# Patient Record
Sex: Male | Born: 1970 | Race: Black or African American | Hispanic: No | Marital: Married | Smoking: Never smoker
Health system: Southern US, Community
[De-identification: ages and names within clinical notes are randomized; demographics above are authoritative.]

## PROBLEM LIST (undated history)

## (undated) DIAGNOSIS — I10 Essential (primary) hypertension: Secondary | ICD-10-CM

## (undated) DIAGNOSIS — E785 Hyperlipidemia, unspecified: Secondary | ICD-10-CM

## (undated) HISTORY — PX: VASECTOMY: SHX75

## (undated) HISTORY — DX: Hyperlipidemia, unspecified: E78.5

## (undated) HISTORY — DX: Essential (primary) hypertension: I10

---

## 2017-06-21 DIAGNOSIS — E669 Obesity, unspecified: Secondary | ICD-10-CM | POA: Insufficient documentation

## 2017-12-02 ENCOUNTER — Encounter: Payer: Self-pay | Admitting: Osteopathic Medicine

## 2017-12-02 ENCOUNTER — Ambulatory Visit (INDEPENDENT_AMBULATORY_CARE_PROVIDER_SITE_OTHER): Payer: No Typology Code available for payment source | Admitting: Osteopathic Medicine

## 2017-12-02 VITALS — BP 132/86 | HR 59 | Temp 98.0°F | Wt 229.9 lb

## 2017-12-02 DIAGNOSIS — M25561 Pain in right knee: Secondary | ICD-10-CM | POA: Diagnosis not present

## 2017-12-02 DIAGNOSIS — Z0000 Encounter for general adult medical examination without abnormal findings: Secondary | ICD-10-CM | POA: Diagnosis not present

## 2017-12-02 DIAGNOSIS — G8929 Other chronic pain: Secondary | ICD-10-CM

## 2017-12-02 DIAGNOSIS — R350 Frequency of micturition: Secondary | ICD-10-CM | POA: Diagnosis not present

## 2017-12-02 DIAGNOSIS — M25552 Pain in left hip: Secondary | ICD-10-CM

## 2017-12-02 DIAGNOSIS — M25562 Pain in left knee: Secondary | ICD-10-CM

## 2017-12-02 DIAGNOSIS — I10 Essential (primary) hypertension: Secondary | ICD-10-CM | POA: Diagnosis not present

## 2017-12-02 DIAGNOSIS — M25551 Pain in right hip: Secondary | ICD-10-CM

## 2017-12-02 MED ORDER — NAPROXEN 500 MG PO TABS: 500.0000 mg | ORAL_TABLET | Freq: Two times a day (BID) | ORAL | 1 refills | Status: DC

## 2017-12-02 MED ORDER — AMLODIPINE BESYLATE 10 MG PO TABS: 10.0000 mg | ORAL_TABLET | Freq: Every day | ORAL | 1 refills | Status: DC

## 2017-12-02 NOTE — Progress Notes (Signed)
HPI: Joseph King is a 47 y.o. male who  has no past medical history on file.  he presents to [facility] today, 12/02/17,  for chief complaint of: New Patient Frequent urination Joint pain  Hx HTN  New patient, works as Charity fundraiser for [employer], cardiac unit.  Never smoker Monogamous, cisgender heterosexual male, declines STI testing  Flu and Tdap UTD  Joint pain  Naproxen as needed for chronic bilateral knee and hip pain.  Knee bit worse on the left, hip a bit worse on the right.  It does not bother him too much, can typically accomplish all ADLs without difficulty.  Occasionally hurts more with climbing stairs or feels stiff, once he gets moving though it improves.  HTN Norvasc 10 mg Checks at home as needed if there is any headaches, otherwise weekly.  Usual home numbers are around 130 systolic goal. (+)FH mom HTN Working on diet/exercise, hopes to possibly get off of blood pressure meds  Frequent urination/nocturia The past week or 2, has noticed urinating more at night, getting up every couple of hours to go to the bathroom.  No dysuria, hematuria, penile discharge or testicular lump/pain.  No feelings of incomplete bladder emptying or interrupted stream.      Past medical, surgical, social and family history reviewed:  Patient Active Problem List   Diagnosis Date Noted  . Essential hypertension 12/02/2017  . Frequent urination 12/02/2017    History reviewed. No pertinent surgical history.  Social History   Tobacco Use  . Smoking status: Never Smoker  . Smokeless tobacco: Never Used  Substance Use Topics  . Alcohol use: Never    Frequency: Never    Family History  Problem Relation Age of Onset  . High blood pressure Mother      Current medication list and allergy/intolerance information reviewed:    Current Outpatient Medications  Medication Sig Dispense Refill  . amLODipine (NORVASC) 10 MG tablet Take 1 tablet (10  mg total) by mouth daily. 90 tablet 1  . naproxen (NAPROSYN) 500 MG tablet Take 1 tablet (500 mg total) by mouth 2 (two) times daily. Prn aches/pains 90 tablet 1   No current facility-administered medications for this visit.     No Known Allergies    Review of Systems:  Constitutional:  No  fever, no chills, No recent illness, No unintentional weight changes. No significant fatigue.   HEENT: No  headache, no vision change, no hearing change, No sore throat, No  sinus pressure  Cardiac: No  chest pain, No  pressure, No palpitations, No  Orthopnea  Respiratory:  No  shortness of breath. No  Cough  Gastrointestinal: No  abdominal pain, No  nausea, No  vomiting,  No  blood in stool, No  diarrhea, No  constipation   Musculoskeletal: +myalgia/arthralgia  Skin: No  Rash, No other wounds/concerning lesions  Genitourinary: No  incontinence, No  abnormal genital bleeding, No abnormal genital discharge  Hem/Onc: No  easy bruising/bleeding, No  abnormal lymph node  Endocrine: No cold intolerance,  No heat intolerance. No polyuria/polydipsia/polyphagia   Neurologic: No  weakness, No  dizziness, No  slurred speech/focal weakness/facial droop  Psychiatric: No  concerns with depression, No  concerns with anxiety, No sleep problems, No mood problems  Exam:  BP 132/86 (BP Location: Left Arm, Patient Position: Sitting, Cuff Size: Large)   Pulse (!) 59   Temp 98 F (36.7 C) (Oral)   Wt 229 lb 14.4 oz (104.3 kg)  Constitutional: VS see above. General Appearance: alert, well-developed, well-nourished, NAD  Eyes: Normal lids and conjunctive, non-icteric sclera  Ears, Nose, Mouth, Throat: MMM, Normal external inspection ears/nares/mouth/lips/gums. TM normal bilaterally. Pharynx/tonsils no erythema, no exudate. Nasal mucosa normal.   Neck: No masses, trachea midline. No thyroid enlargement. No tenderness/mass appreciated. No lymphadenopathy  Respiratory: Normal respiratory effort. no  wheeze, no rhonchi, no rales  Cardiovascular: S1/S2 normal, no murmur, no rub/gallop auscultated. RRR. No lower extremity edema. Pedal pulse II/IV bilaterally DP and PT.   Gastrointestinal: Nontender, no masses. No hepatomegaly, no splenomegaly. No hernia appreciated. Bowel sounds normal. Rectal exam deferred.   Musculoskeletal: Gait normal. No clubbing/cyanosis of digits.   Knees: No crepitus bilaterally, bilateral intact MCL/LCL/PCL/ACL, negative meniscus signs  Hips: Bilaterally some increased pain with internal rotation, Faber negative  Neurological: Normal balance/coordination. No tremor. No cranial nerve deficit on limited exam. Motor and sensation intact and symmetric. Cerebellar reflexes intact.   Skin: warm, dry, intact. No rash/ulcer. No concerning nevi or subq nodules on limited exam.    Psychiatric: Normal judgment/insight. Normal mood and affect. Oriented x3.    .  ASSESSMENT/PLAN:   Encounter for medical examination to establish care - Plan: CBC, COMPLETE METABOLIC PANEL WITH GFR, Lipid panel, TSH, PSA, Total with Reflex to PSA, Free, Urine Microscopic, Urinalysis, Urine Culture  Essential hypertension - Plan: CBC, COMPLETE METABOLIC PANEL WITH GFR, Lipid panel, TSH, PSA, Total with Reflex to PSA, Free, Urine Microscopic, Urinalysis, Urine Culture  Frequent urination - Plan: CBC, COMPLETE METABOLIC PANEL WITH GFR, Lipid panel, TSH, PSA, Total with Reflex to PSA, Free, Urine Microscopic, Urinalysis, Urine Culture  Chronic pain of both knees - Plan: DG Knee Bilateral Standing AP, CBC, COMPLETE METABOLIC PANEL WITH GFR, Lipid panel, TSH, PSA, Total with Reflex to PSA, Free, Urine Microscopic, Urinalysis, Urine Culture  Hip pain, bilateral - Plan: DG Knee Bilateral Standing AP, DG HIPS BILAT W OR W/O PELVIS 2V, CBC, COMPLETE METABOLIC PANEL WITH GFR, Lipid panel, TSH, PSA, Total with Reflex to PSA, Free, Urine Microscopic, Urinalysis, Urine Culture   Meds ordered this  encounter  Medications  . amLODipine (NORVASC) 10 MG tablet    Sig: Take 1 tablet (10 mg total) by mouth daily.    Dispense:  90 tablet    Refill:  1  . naproxen (NAPROSYN) 500 MG tablet    Sig: Take 1 tablet (500 mg total) by mouth 2 (two) times daily. Prn aches/pains    Dispense:  90 tablet    Refill:  1       Visit summary with medication list and pertinent instructions was printed for patient to review. All questions at time of visit were answered - patient instructed to contact office with any additional concerns. ER/RTC precautions were reviewed with the patient.   Follow-up plan: Return in about 6 months (around 06/02/2018) for ANNUAL PHYSICAL - sooner if needed .     Please note: voice recognition software was used to produce this document, and typos may escape review. Please contact Dr. Lyn Hollingshead for any needed clarifications.

## 2017-12-08 LAB — COMPLETE METABOLIC PANEL WITH GFR
AG Ratio: 1.4 (calc) (ref 1.0–2.5)
ALT: 13 U/L (ref 9–46)
AST: 17 U/L (ref 10–40)
Albumin: 4.4 g/dL (ref 3.6–5.1)
Alkaline phosphatase (APISO): 48 U/L (ref 40–115)
BUN: 12 mg/dL (ref 7–25)
CALCIUM: 9.8 mg/dL (ref 8.6–10.3)
CO2: 29 mmol/L (ref 20–32)
CREATININE: 1.25 mg/dL (ref 0.60–1.35)
Chloride: 104 mmol/L (ref 98–110)
GFR, EST NON AFRICAN AMERICAN: 67 mL/min/{1.73_m2} (ref >=60)
GFR, Est African American: 78 mL/min/{1.73_m2} (ref >=60)
Globulin: 3.2 g/dL (calc) (ref 1.9–3.7)
Glucose, Bld: 88 mg/dL (ref 65–139)
Potassium: 4.1 mmol/L (ref 3.5–5.3)
Sodium: 140 mmol/L (ref 135–146)
Total Bilirubin: 0.6 mg/dL (ref 0.2–1.2)
Total Protein: 7.6 g/dL (ref 6.1–8.1)

## 2017-12-08 LAB — URINALYSIS
BILIRUBIN URINE: NEGATIVE
GLUCOSE, UA: NEGATIVE
Hgb urine dipstick: NEGATIVE
KETONES UR: NEGATIVE
Leukocytes, UA: NEGATIVE
Nitrite: NEGATIVE
PROTEIN: NEGATIVE
Specific Gravity, Urine: 1.018 (ref 1.001–1.03)
pH: 6 (ref 5.0–8.0)

## 2017-12-08 LAB — CBC
HCT: 44.1 % (ref 38.5–50.0)
Hemoglobin: 14.6 g/dL (ref 13.2–17.1)
MCH: 26.6 pg — ABNORMAL LOW (ref 27.0–33.0)
MCHC: 33.1 g/dL (ref 32.0–36.0)
MCV: 80.5 fL (ref 80.0–100.0)
MPV: 10.9 fL (ref 7.5–12.5)
PLATELETS: 256 10*3/uL (ref 140–400)
RBC: 5.48 10*6/uL (ref 4.20–5.80)
RDW: 12.9 % (ref 11.0–15.0)
WBC: 4.3 10*3/uL (ref 3.8–10.8)

## 2017-12-08 LAB — URINE CULTURE
MICRO NUMBER: 91368391
RESULT: NO GROWTH
SPECIMEN QUALITY:: ADEQUATE

## 2017-12-08 LAB — LIPID PANEL
CHOL/HDL RATIO: 3.2 (calc) (ref <5.0)
Cholesterol: 191 mg/dL (ref <200)
HDL: 59 mg/dL (ref >40)
LDL Cholesterol (Calc): 113 mg/dL (calc) — ABNORMAL HIGH
NON-HDL CHOLESTEROL (CALC): 132 mg/dL — AB (ref <130)
TRIGLYCERIDES: 86 mg/dL (ref <150)

## 2017-12-08 LAB — URINALYSIS, MICROSCOPIC ONLY
BACTERIA UA: NONE SEEN /HPF
HYALINE CAST: NONE SEEN /LPF
RBC / HPF: NONE SEEN /HPF (ref 0–2)
SQUAMOUS EPITHELIAL / LPF: NONE SEEN /HPF (ref <=5)
WBC UA: NONE SEEN /HPF (ref 0–5)

## 2017-12-08 LAB — TSH: TSH: 2.22 mIU/L (ref 0.40–4.50)

## 2017-12-08 LAB — PSA, TOTAL WITH REFLEX TO PSA, FREE: PSA, Total: 0.2 ng/mL (ref <=4.0)

## 2017-12-27 ENCOUNTER — Telehealth: Payer: Self-pay | Admitting: Osteopathic Medicine

## 2017-12-27 MED ORDER — AZITHROMYCIN 250 MG PO TABS: ORAL_TABLET | ORAL | 0 refills | Status: DC

## 2017-12-27 MED FILL — AZITHROMYCIN 250 MG TABLET: 250 | 5 days supply | Qty: 6 | Fill #0

## 2017-12-27 NOTE — Telephone Encounter (Signed)
Postexposure prophylaxis for pertussis w/ azithromycin was sent to [facility] on [address]  He should be covered with his recent Tdap vaccine but this is not always preventive, so prophylaxis is still recommended  If he develops symptoms, he needs to be seen  Immunization History  Administered Date(s) Administered  . Influenza-Unspecified 09/25/2017  . Td 01/25/2001  . Tdap 02/04/2016  . Zoster Recombinat (Shingrix) 10/25/2016

## 2017-12-27 NOTE — Telephone Encounter (Addendum)
Called patient to give the recommendations as listed below per PCP and was unable to leave a message for patient due to VM being full. Will try again later.

## 2017-12-27 NOTE — Telephone Encounter (Signed)
Patient called and stated that his son was diagnosed with Whooping cough and was told by the child's pediatrician that he would need to be treated as well. He wants to know what the recommended treatment would be since he is an adult. Pharmacy on file is correct. Please advise.

## 2017-12-28 NOTE — Telephone Encounter (Signed)
Called patient again to give recommendations and the VM was full.

## 2017-12-28 NOTE — Telephone Encounter (Signed)
Patient was notified and voices understanding.  

## 2018-01-11 ENCOUNTER — Telehealth: Payer: Self-pay

## 2018-01-11 MED ORDER — ATORVASTATIN CALCIUM 20 MG PO TABS: 20.0000 mg | ORAL_TABLET | Freq: Every day | ORAL | 3 refills | Status: DC

## 2018-01-11 NOTE — Telephone Encounter (Signed)
Sent!

## 2018-01-11 NOTE — Telephone Encounter (Signed)
Pt called, he would like a rx for a statin medication sent to local pharmacy. Pls advise, thanks.

## 2018-01-12 NOTE — Telephone Encounter (Signed)
Left a detailed vm msg for pt regarding new med sent to local pharmacy. Direct call back info provided.

## 2018-01-20 MED FILL — ATORVASTATIN CALCIUM 20 MG: 20 | 90 days supply | Qty: 90 | Fill #0

## 2018-05-05 MED FILL — ATORVASTATIN 20 MG TABLET: 20 | 90 days supply | Qty: 90 | Fill #0

## 2018-06-02 ENCOUNTER — Encounter: Payer: Self-pay | Admitting: Osteopathic Medicine

## 2018-08-03 ENCOUNTER — Encounter: Payer: Self-pay | Admitting: Osteopathic Medicine

## 2018-08-03 ENCOUNTER — Ambulatory Visit (INDEPENDENT_AMBULATORY_CARE_PROVIDER_SITE_OTHER): Payer: No Typology Code available for payment source | Admitting: Osteopathic Medicine

## 2018-08-03 ENCOUNTER — Ambulatory Visit (INDEPENDENT_AMBULATORY_CARE_PROVIDER_SITE_OTHER): Payer: No Typology Code available for payment source

## 2018-08-03 ENCOUNTER — Other Ambulatory Visit: Payer: Self-pay

## 2018-08-03 VITALS — BP 124/77 | HR 71 | Temp 98.6°F | Wt 230.0 lb

## 2018-08-03 DIAGNOSIS — R0789 Other chest pain: Secondary | ICD-10-CM | POA: Diagnosis not present

## 2018-08-03 DIAGNOSIS — M79672 Pain in left foot: Secondary | ICD-10-CM | POA: Diagnosis not present

## 2018-08-03 DIAGNOSIS — Z1211 Encounter for screening for malignant neoplasm of colon: Secondary | ICD-10-CM | POA: Diagnosis not present

## 2018-08-03 DIAGNOSIS — Z0000 Encounter for general adult medical examination without abnormal findings: Secondary | ICD-10-CM | POA: Diagnosis not present

## 2018-08-03 DIAGNOSIS — I10 Essential (primary) hypertension: Secondary | ICD-10-CM

## 2018-08-03 MED ORDER — ATORVASTATIN CALCIUM 20 MG PO TABS: 20.0000 mg | ORAL_TABLET | Freq: Every day | ORAL | 3 refills | Status: DC

## 2018-08-03 MED ORDER — AMLODIPINE BESYLATE 10 MG PO TABS: 10.0000 mg | ORAL_TABLET | Freq: Every day | ORAL | 3 refills | Status: DC

## 2018-08-03 MED ORDER — NAPROXEN 500 MG PO TABS: 500.0000 mg | ORAL_TABLET | Freq: Two times a day (BID) | ORAL | 1 refills | Status: DC

## 2018-08-03 MED FILL — ATORVASTATIN 20 MG TABLET: 20 | 90 days supply | Qty: 90 | Fill #0

## 2018-08-03 NOTE — Patient Instructions (Addendum)
Chest pain: Sharp intermittent pain that resolves on its own seems more likely to be costochondritis or premature ventricular contractions, as opposed to coronary artery disease which would typically cause chest pain on exertion. EKG today shows no concerns, will get you set up for a stress test as well to evaluate for coronary artery health.    Foot pain:  Will get Xray today and would probably consider referral to podiatry to construct orthotics to alleviate pain.    General Preventive Care  Most recent routine screening lipids/other labs:    Everyone should have blood pressure checked once per year. Goal 130/80 or less   Tobacco: don't!   Alcohol: responsible moderation is ok for most adults - if you have concerns about your alcohol intake, please talk to me!   Exercise: as tolerated to reduce risk of cardiovascular disease and diabetes. Strength training will also prevent osteoporosis.   Mental health: if need for mental health care (medicines, counseling, other), or concerns about moods, please let me know!   Sexual health: if need for STD testing, or if concerns with libido/pain problems, please let me know!  Advanced Directive: Living Will and/or Healthcare Power of Attorney recommended for all adults, regardless of age or health.  Vaccines  Flu vaccine: recommended for almost everyone, every fall.   Shingles vaccine: Shingrix recommended after age 19. Per records, you had one shot in 2018 but I think this might have been the Varicella vaccine, can you confirm this?   Pneumonia vaccines: Prevnar and Pneumovax recommended after age 24, or sooner if certain medical conditions.  Tetanus booster: Tdap recommended every 10 years. Due 2028 Cancer screenings   Colon cancer screening: recommended for everyone at age 47, but some folks need a colonoscopy sooner if risk factors   Prostate cancer screening: PSA blood test around age 68  Lung cancer screening: not needed for  non-smokers  Infection screenings . HIV: recommended screening at least once age 54-65, more often as needed. . Gonorrhea/Chlamydia: screening as needed . Hepatitis C: recommended for anyone born 64-1967 - not needed . TB: certain at-risk populations, or depending on work requirements and/or travel history Other . Bone Density Test: recommended for men at age 56, sooner depending on risk factors . Abdominal Aortic Aneurysm: screening with ultrasound recommended once for men age 41-75 who have ever smoked       Please note: Preventive care issues were addressed today as part of your annual wellness physical, and this care should be covered under your insurance. However there were other medical issues which were also addressed today, and insurance may bill you separately for a "problem-based visit" for this care: Chest pain, foot pain. Any questions or concerns about charges which may appear on your billing statement should be directed to your insurance company or to [facility] billing department, and they will contact our office if there are further concerns.

## 2018-08-03 NOTE — Progress Notes (Signed)
HPI: Joseph King is a 48 y.o. male who  has a past medical history of High blood pressure.  he presents to [facility] today, 08/03/18,  for chief complaint of: Annual physical  Chest pain Foot pain    Patient here for annual physical / wellness exam.  See preventive care reviewed as below.    Additional concerns today include:   Chest pain: intermittent, random, maybe once per week over the past couple months, lasts about 5 mins, resolves spontaneously, DOES NOT occur with exertion and he runs frequently without problems.   Foot pain: Lateral L foot, bothers him after about 5 miles of running or if he's been on his feet most of the day, sore, hurts to the touch, bothers him to drive sometimes.        Past medical, surgical, social and family history reviewed:  Patient Active Problem List   Diagnosis Date Noted  . Other chest pain 08/03/2018  . Left foot pain 08/03/2018  . Essential hypertension 12/02/2017  . Frequent urination 12/02/2017    No past surgical history on file.  Social History   Tobacco Use  . Smoking status: Never Smoker  . Smokeless tobacco: Never Used  Substance Use Topics  . Alcohol use: Never    Frequency: Never    Family History  Problem Relation Age of Onset  . High blood pressure Mother      Current medication list and allergy/intolerance information reviewed:    Current Outpatient Medications  Medication Sig Dispense Refill  . amLODipine (NORVASC) 10 MG tablet Take 1 tablet (10 mg total) by mouth daily. 90 tablet 3  . atorvastatin (LIPITOR) 20 MG tablet Take 1 tablet (20 mg total) by mouth daily. 90 tablet 3  . naproxen (NAPROSYN) 500 MG tablet Take 1 tablet (500 mg total) by mouth 2 (two) times daily. Prn aches/pains 90 tablet 1   No current facility-administered medications for this visit.     No Known Allergies    Review of Systems:  Constitutional:  No  fever, no chills, No  recent illness, No unintentional weight changes. No significant fatigue.   HEENT: No  headache, no vision change, no hearing change, No sore throat, No  sinus pressure  Cardiac: +chest pain, No  pressure, No palpitations, No  Orthopnea  Respiratory:  No  shortness of breath. No  Cough  Gastrointestinal: No  abdominal pain, No  nausea, No  vomiting,  No  blood in stool, No  diarrhea, No  constipation   Musculoskeletal: +myalgia/arthralgia  Skin: No  Rash, No other wounds/concerning lesions  Genitourinary: No  incontinence, No  abnormal genital bleeding, No abnormal genital discharge  Hem/Onc: No  easy bruising/bleeding, No  abnormal lymph node  Endocrine: No cold intolerance,  No heat intolerance. No polyuria/polydipsia/polyphagia   Neurologic: No  weakness, No  dizziness, No  slurred speech/focal weakness/facial droop  Psychiatric: No  concerns with depression, No  concerns with anxiety, No sleep problems, No mood problems  Exam:  BP 124/77 (BP Location: Left Arm, Patient Position: Sitting, Cuff Size: Large)   Pulse 71   Temp 98.6 F (37 C) (Oral)   Wt 230 lb (104.3 kg)   Constitutional: VS see above. General Appearance: alert, well-developed, well-nourished, NAD  Eyes: Normal lids and conjunctive, non-icteric sclera  Neck: No masses, trachea midline. No thyroid enlargement. No tenderness/mass appreciated. No lymphadenopathy  Respiratory: Normal respiratory effort. no wheeze, no rhonchi, no rales  Cardiovascular: S1/S2 normal,  no murmur, no rub/gallop auscultated. RRR. No lower extremity edema.   Gastrointestinal: Nontender, no masses. No hepatomegaly, no splenomegaly. No hernia appreciated. Bowel sounds normal. Rectal exam deferred.   Musculoskeletal: Gait normal. No clubbing/cyanosis of digits.   Neurological: Normal balance/coordination. No tremor. No cranial nerve deficit on limited exam. Motor and sensation intact and symmetric. Cerebellar reflexes intact.   Skin:  warm, dry, intact. No rash/ulcer. No concerning nevi or subq nodules on limited exam.    Psychiatric: Normal judgment/insight. Normal mood and affect. Oriented x3.     EKG interpretation: Rate: 62 Rhythm: sinus No ST/T changes concerning for acute ischemia/infarct  Nonspecific ST/T abnormalities: flat/inverted T in III Previous EKG done but no tracings available for visual review        ASSESSMENT/PLAN: The primary encounter diagnosis was Annual physical exam. Diagnoses of Essential hypertension, Other chest pain, Left foot pain, and Screening for malignant neoplasm of colon were also pertinent to this visit.   Orders Placed This Encounter  Procedures  . DG Foot Complete Left  . CBC  . COMPLETE METABOLIC PANEL WITH GFR  . Lipid panel  . TSH  . Magnesium  . Urinalysis, Routine w reflex microscopic  . Ambulatory referral to Gastroenterology  . Exercise Tolerance Test  . EKG 12-Lead    Meds ordered this encounter  Medications  . naproxen (NAPROSYN) 500 MG tablet    Sig: Take 1 tablet (500 mg total) by mouth 2 (two) times daily. Prn aches/pains    Dispense:  90 tablet    Refill:  1  . amLODipine (NORVASC) 10 MG tablet    Sig: Take 1 tablet (10 mg total) by mouth daily.    Dispense:  90 tablet    Refill:  3  . atorvastatin (LIPITOR) 20 MG tablet    Sig: Take 1 tablet (20 mg total) by mouth daily.    Dispense:  90 tablet    Refill:  3    Patient Instructions  Chest pain: Sharp intermittent pain that resolves on its own seems more likely to be costochondritis or premature ventricular contractions, as opposed to coronary artery disease which would typically cause chest pain on exertion. EKG today shows no concerns, will get you set up for a stress test as well to evaluate for coronary artery health.    Foot pain:  Will get Xray today and would probably consider referral to podiatry to construct orthotics to alleviate pain.    General Preventive Care  Most recent  routine screening lipids/other labs:    Everyone should have blood pressure checked once per year. Goal 130/80 or less   Tobacco: don't!   Alcohol: responsible moderation is ok for most adults - if you have concerns about your alcohol intake, please talk to me!   Exercise: as tolerated to reduce risk of cardiovascular disease and diabetes. Strength training will also prevent osteoporosis.   Mental health: if need for mental health care (medicines, counseling, other), or concerns about moods, please let me know!   Sexual health: if need for STD testing, or if concerns with libido/pain problems, please let me know!  Advanced Directive: Living Will and/or Healthcare Power of Attorney recommended for all adults, regardless of age or health.  Vaccines  Flu vaccine: recommended for almost everyone, every fall.   Shingles vaccine: Shingrix recommended after age 50. Per records, you had one shot in 2018 but I think this might have been the Varicella vaccine, can you confirm this?   Pneumonia vaccines:  Prevnar and Pneumovax recommended after age 50, or sooner if certain medical conditions.  Tetanus booster: Tdap recommended every 10 years. Due 2028 Cancer screenings   Colon cancer screening: recommended for everyone at age 50, but some folks need a colonoscopy sooner if risk factors   Prostate cancer screening: PSA blood test around age 50  Lung cancer screening: not needed for non-smokers  Infection screenings . HIV: recommended screening at least once age 50-65, more often as needed. . Gonorrhea/Chlamydia: screening as needed . Hepatitis C: recommended for anyone born [id number] - not needed . TB: certain at-risk populations, or depending on work requirements and/or travel history Other . Bone Density Test: recommended for men at age 50, sooner depending on risk factors . Abdominal Aortic Aneurysm: screening with ultrasound recommended once for men age 50-75 who have ever smoked        Please note: Preventive care issues were addressed today as part of your annual wellness physical, and this care should be covered under your insurance. However there were other medical issues which were also addressed today, and insurance may bill you separately for a "problem-based visit" for this care: Chest pain, foot pain. Any questions or concerns about charges which may appear on your billing statement should be directed to your insurance company or to [facility] billing department, and they will contact our office if there are further concerns.            Visit summary with medication list and pertinent instructions was printed for patient to review. All questions at time of visit were answered - patient instructed to contact office with any additional concerns or updates. ER/RTC precautions were reviewed with the patient.      Please note: voice recognition software was used to produce this document, and typos may escape review. Please contact Dr. Lyn HollingsheadAlexander for any needed clarifications.     Follow-up plan: Return for ANNUAL (call week prior to visit for lab orders).

## 2018-08-04 LAB — URINALYSIS, ROUTINE W REFLEX MICROSCOPIC
Bilirubin Urine: NEGATIVE
Glucose, UA: NEGATIVE
Hgb urine dipstick: NEGATIVE
Leukocytes,Ua: NEGATIVE
Nitrite: NEGATIVE
Protein, ur: NEGATIVE
Specific Gravity, Urine: 1.03 (ref 1.001–1.03)
pH: <=5 (ref 5.0–8.0)

## 2018-08-04 LAB — COMPLETE METABOLIC PANEL WITH GFR
AG Ratio: 1.5 (calc) (ref 1.0–2.5)
ALT: 17 U/L (ref 9–46)
AST: 20 U/L (ref 10–35)
Albumin: 4.1 g/dL (ref 3.6–5.1)
Alkaline phosphatase (APISO): 40 U/L (ref 35–144)
BUN: 15 mg/dL (ref 7–25)
CO2: 27 mmol/L (ref 20–32)
Calcium: 9.2 mg/dL (ref 8.6–10.3)
Chloride: 106 mmol/L (ref 98–110)
Creat: 1.31 mg/dL (ref 0.70–1.33)
GFR, Est African American: 73 mL/min/{1.73_m2} (ref >=60)
GFR, Est Non African American: 63 mL/min/{1.73_m2} (ref >=60)
Globulin: 2.8 g/dL (calc) (ref 1.9–3.7)
Glucose, Bld: 76 mg/dL (ref 65–99)
Potassium: 4 mmol/L (ref 3.5–5.3)
Sodium: 140 mmol/L (ref 135–146)
Total Bilirubin: 0.8 mg/dL (ref 0.2–1.2)
Total Protein: 6.9 g/dL (ref 6.1–8.1)

## 2018-08-04 LAB — LIPID PANEL
Cholesterol: 134 mg/dL (ref <200)
HDL: 56 mg/dL (ref >=40)
LDL Cholesterol (Calc): 64 mg/dL (calc)
Non-HDL Cholesterol (Calc): 78 mg/dL (calc) (ref <130)
Total CHOL/HDL Ratio: 2.4 (calc) (ref <5.0)
Triglycerides: 59 mg/dL (ref <150)

## 2018-08-04 LAB — CBC
HCT: 43.8 % (ref 38.5–50.0)
Hemoglobin: 14.4 g/dL (ref 13.2–17.1)
MCH: 26.9 pg — ABNORMAL LOW (ref 27.0–33.0)
MCHC: 32.9 g/dL (ref 32.0–36.0)
MCV: 81.9 fL (ref 80.0–100.0)
MPV: 10.8 fL (ref 7.5–12.5)
Platelets: 238 10*3/uL (ref 140–400)
RBC: 5.35 10*6/uL (ref 4.20–5.80)
RDW: 13.5 % (ref 11.0–15.0)
WBC: 3.7 10*3/uL — ABNORMAL LOW (ref 3.8–10.8)

## 2018-08-04 LAB — MAGNESIUM: Magnesium: 2 mg/dL (ref 1.5–2.5)

## 2018-08-04 LAB — TSH: TSH: 1.88 mIU/L (ref 0.40–4.50)

## 2018-08-07 NOTE — Addendum Note (Signed)
Addended by: Maryla Morrow on: 08/07/2018 08:17 AM   Modules accepted: Orders

## 2018-08-25 ENCOUNTER — Other Ambulatory Visit: Payer: Self-pay | Admitting: *Deleted

## 2018-08-25 MED ORDER — AMLODIPINE BESYLATE 10 MG PO TABS: 10.0000 mg | ORAL_TABLET | Freq: Every day | ORAL | 3 refills | Status: DC

## 2018-08-25 MED FILL — ATORVASTATIN 20 MG TABLET: 20 | 90 days supply | Qty: 90 | Fill #0

## 2018-08-25 MED FILL — AMLODIPINE BESYLATE 10 MG T: 10 | 90 days supply | Qty: 90 | Fill #0

## 2018-09-01 ENCOUNTER — Ambulatory Visit (INDEPENDENT_AMBULATORY_CARE_PROVIDER_SITE_OTHER): Payer: No Typology Code available for payment source

## 2018-09-01 ENCOUNTER — Other Ambulatory Visit: Payer: Self-pay

## 2018-09-01 ENCOUNTER — Encounter: Payer: Self-pay | Admitting: Podiatry

## 2018-09-01 ENCOUNTER — Ambulatory Visit (INDEPENDENT_AMBULATORY_CARE_PROVIDER_SITE_OTHER): Payer: No Typology Code available for payment source | Admitting: Podiatry

## 2018-09-01 VITALS — Temp 98.2°F

## 2018-09-01 DIAGNOSIS — M7751 Other enthesopathy of right foot: Secondary | ICD-10-CM

## 2018-09-01 DIAGNOSIS — M779 Enthesopathy, unspecified: Secondary | ICD-10-CM

## 2018-09-01 DIAGNOSIS — M7752 Other enthesopathy of left foot: Secondary | ICD-10-CM

## 2018-09-01 DIAGNOSIS — M79672 Pain in left foot: Secondary | ICD-10-CM

## 2018-09-01 NOTE — Patient Instructions (Signed)
Peroneal Tendinopathy Rehab Ask your health care provider which exercises are safe for you. Do exercises exactly as told by your health care provider and adjust them as directed. It is normal to feel mild stretching, pulling, tightness, or discomfort as you do these exercises. Stop right away if you feel sudden pain or your pain gets worse. Do not begin these exercises until told by your health care provider. Stretching and range-of-motion exercises These exercises warm up your muscles and joints and improve the movement and flexibility of your ankle. These exercises also help to relieve pain and stiffness. Gastroc and soleus stretch, standing This is an exercise in which you stand on a step and use your body weight to stretch your calf muscles. To do this exercise: 1. Stand on the edge of a step on the ball of your left / right foot. The ball of your foot is on the walking surface, right under your toes. 2. Keep your other foot firmly on the same step. 3. Hold on to the wall, a railing, or a chair for balance. 4. Slowly lift your other foot, allowing your body weight to press your left / right heel down over the edge of the step. You should feel a stretch in your left / right calf (gastrocnemius and soleus). 5. Hold this position for __________ seconds. 6. Return both feet to the step. 7. Repeat this exercise with a slight bend in your left / right knee. Repeat __________ times with your left / right knee straight and __________ times with your left / right knee bent. Complete this exercise __________ times a day. Strengthening exercises These exercises build strength and endurance in your foot and ankle. Endurance is the ability to use your muscles for a long time, even after they get tired. Ankle dorsiflexion with band  1. Secure a rubber exercise band or tube to an object, such as a table leg, that will not move when the band is pulled. 2. Secure the other end of the band around your left /  right foot. 3. Sit on the floor, facing the object with your left / right leg extended. The band or tube should be slightly tense when your foot is relaxed. 4. Slowly flex your left / right ankle and toes to bring your foot toward you (dorsiflexion). 5. Hold this position for __________ seconds. 6. Let the band or tube slowly pull your foot back to the starting position. Repeat __________ times. Complete this exercise __________ times a day. Ankle eversion 1. Sit on the floor with your legs straight out in front of you. 2. Loop a rubber exercise band or tube around the ball of your left / right foot. The ball of your foot is on the walking surface, right under your toes. 3. Hold the ends of the band in your hands, or secure the band to a stable object. The band or tube should be slightly tense when your foot is relaxed. 4. Slowly push your foot outward, away from your other leg (eversion). 5. Hold this position for __________ seconds. 6. Slowly return your foot to the starting position. Repeat __________ times. Complete this exercise __________ times a day. Plantar flexion, standing This exercise is sometimes called standing heel raise. 1. Stand with your feet shoulder-width apart. 2. Place your hands on a wall or table to steady yourself as needed, but try not to use it for support. 3. Keep your weight spread evenly over the width of your feet while you slowly   rise up on your toes (plantar flexion). If told by your health care provider: ? Shift your weight toward your left / right leg until you feel challenged. ? Stand on your left / right leg only. 4. Hold this position for __________ seconds. Repeat __________ times. Complete this exercise __________ times a day. Single leg stand 1. Without shoes, stand near a railing or in a doorway. You may hold on to the railing or door frame as needed. 2. Stand on your left / right foot. Keep your big toe down on the floor and try to keep your arch  lifted. ? Do not roll to the outside of your foot. ? If this exercise is too easy, you can try it with your eyes closed or while standing on a pillow. 3. Hold this position for __________ seconds. Repeat __________ times. Complete this exercise __________ times a day. This information is not intended to replace advice given to you by your health care provider. Make sure you discuss any questions you have with your health care provider. Document Released: 01/11/2005 Document Revised: 05/02/2018 Document Reviewed: 05/02/2018 Elsevier Patient Education  2020 Elsevier Inc.  

## 2018-09-04 MED FILL — AMLODIPINE BESYLATE 10 MG T: 10 | 90 days supply | Qty: 90 | Fill #0

## 2018-09-06 NOTE — Progress Notes (Signed)
Subjective:   Patient ID: Joseph King, male   DOB: 48 y.o.   MRN: [id number]   HPI 48 year old male presents the office today for concerns of pain in the left foot lateral aspect.  Is been ongoing for the last 1 year is been intermittent.  Hurts on the side lateral aspect he points the fifth metatarsal base.  Others in the ball the foot at times.  He states it depends on the shoe that he is wearing his activity level.  No recent injury.  No significant swelling or redness.  No other concerns.   Review of Systems  All other systems reviewed and are negative.  Past Medical History:  Diagnosis Date  . High blood pressure     History reviewed. No pertinent surgical history.   Current Outpatient Medications:  .  amLODipine (NORVASC) 10 MG tablet, Take 1 tablet (10 mg total) by mouth daily., Disp: 90 tablet, Rfl: 3 .  atorvastatin (LIPITOR) 20 MG tablet, Take 1 tablet (20 mg total) by mouth daily., Disp: 90 tablet, Rfl: 3 .  naproxen (NAPROSYN) 500 MG tablet, Take 1 tablet (500 mg total) by mouth 2 (two) times daily. Prn aches/pains, Disp: 90 tablet, Rfl: 1  No Known Allergies       Objective:  Physical Exam  General: AAO x3, NAD  Dermatological: Skin is warm, dry and supple bilateral. Nails x 10 are well manicured; remaining integument appears unremarkable at this time. There are no open sores, no preulcerative lesions, no rash or signs of infection present.  Vascular: Dorsalis Pedis artery and Posterior Tibial artery pedal pulses are 2/4 bilateral with immedate capillary fill time. Pedal hair growth present. No varicosities and no lower extremity edema present bilateral. There is no pain with calf compression, swelling, warmth, erythema.   Neruologic: Grossly intact via light touch bilateral. Vibratory intact via tuning fork bilateral. Protective threshold with Semmes Wienstein monofilament intact to all pedal sites bilateral.    Musculoskeletal: Tenderness palpation  with metatarsal base and left foot.  No pain on the course of the peroneal tendon.  The tendons appear to be intact.  No significant edema, erythema or increased warmth.  Muscular strength 5/5 in all groups tested bilateral.  Gait: Unassisted, Nonantalgic.       Assessment:   Left foot fifth metatarsal base pain, insertional peroneal tendinitis     Plan:  -Treatment options discussed including all alternatives, risks, and complications -Etiology of symptoms were discussed -I reviewed the x-rays he had done previously last month.  No evidence of acute fracture. -Discussed steroid injection but he wants to hold off.  We discussed orthotics.  He wants to proceed with this. Betha evaluate the patient he was measured for orthotics.  Also discussed shoe modifications.  Discussed general stretching exercises for the peroneal tendons.  Return in about 3 weeks (around 09/22/2018). PUO  Trula Slade DPM

## 2018-09-22 ENCOUNTER — Other Ambulatory Visit: Payer: Self-pay

## 2018-09-22 ENCOUNTER — Other Ambulatory Visit: Payer: No Typology Code available for payment source | Admitting: Orthotics

## 2018-10-19 ENCOUNTER — Encounter: Payer: Self-pay | Admitting: Internal Medicine

## 2018-11-09 MED FILL — ATORVASTATIN 20 MG TABLET: 20 | 90 days supply | Qty: 90 | Fill #1

## 2018-11-20 MED FILL — AMLODIPINE BESYLATE 10 MG T: 10 | 90 days supply | Qty: 90 | Fill #1

## 2019-03-08 MED FILL — AMLODIPINE BESYLATE 10 MG T: 10 | 30 days supply | Qty: 30 | Fill #2

## 2019-03-08 MED FILL — ATORVASTATIN 20 MG TABLET: 20 | 30 days supply | Qty: 30 | Fill #2

## 2019-04-10 MED FILL — AMLODIPINE BESYLATE 10 MG T: 10 | 30 days supply | Qty: 30 | Fill #3

## 2019-04-11 DIAGNOSIS — Z23 Encounter for immunization: Secondary | ICD-10-CM | POA: Diagnosis not present

## 2019-04-12 ENCOUNTER — Telehealth: Payer: Self-pay | Admitting: Radiology

## 2019-04-12 NOTE — Telephone Encounter (Signed)
Exercise treadmill test as ordered back in July 2020 when the treadmill room was shut down due to covid. Please advise if patient still needs the test scheduled. Currently patients are being asked to have a covid test 4 days prior and will be required to self quarantine until the appointment.   If no longer need can you please cancel the order. Thanks

## 2019-04-12 NOTE — Telephone Encounter (Signed)
See below, have not seen patient since July.  We had ordered stress test due to intermittent chest pain, I did not have a strong suspicion for cardiac issue, but if patient would like to proceed with the test, we can do that.    Can we call him and see if he is still having the chest pain, and if so we can get him set up for the stress test?

## 2019-04-13 NOTE — Telephone Encounter (Signed)
Left a detailed vm msg for pt regarding provider's note. Aware to return a call back to clinic if he would like to proceed with stress test referral. Direct call back info provided.

## 2019-05-02 DIAGNOSIS — Z23 Encounter for immunization: Secondary | ICD-10-CM | POA: Diagnosis not present

## 2019-05-09 MED FILL — ATORVASTATIN 20 MG TABLET: 20 | 30 days supply | Qty: 30 | Fill #3

## 2019-07-15 DIAGNOSIS — M542 Cervicalgia: Secondary | ICD-10-CM | POA: Diagnosis not present

## 2019-07-17 ENCOUNTER — Other Ambulatory Visit: Payer: Self-pay

## 2019-07-17 ENCOUNTER — Encounter: Payer: Self-pay | Admitting: Sports Medicine

## 2019-07-17 ENCOUNTER — Ambulatory Visit (INDEPENDENT_AMBULATORY_CARE_PROVIDER_SITE_OTHER): Payer: BC Managed Care – PPO

## 2019-07-17 ENCOUNTER — Ambulatory Visit (INDEPENDENT_AMBULATORY_CARE_PROVIDER_SITE_OTHER): Payer: BC Managed Care – PPO | Admitting: Sports Medicine

## 2019-07-17 DIAGNOSIS — M542 Cervicalgia: Secondary | ICD-10-CM

## 2019-07-17 MED ORDER — MELOXICAM 15 MG PO TABS: ORAL_TABLET | ORAL | 3 refills | Status: DC

## 2019-07-17 MED ORDER — CYCLOBENZAPRINE HCL 10 MG PO TABS: ORAL_TABLET | ORAL | 0 refills | Status: DC
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
## 2019-07-18 ENCOUNTER — Telehealth: Payer: Self-pay

## 2019-07-18 MED ORDER — CERVICAL COLLAR MISC: 0 refills | Status: DC

## 2019-07-18 NOTE — Telephone Encounter (Signed)
Advised patient of imaging results.   Requesting Rx for soft neck brace (not C-collar).

## 2019-07-18 NOTE — Addendum Note (Signed)
Addended by: Monica Becton on: 07/18/2019 04:46 PM   Modules accepted: Orders

## 2019-07-18 NOTE — Telephone Encounter (Deleted)
No problem, I am going to write a prescription for it but he can probably find this at any medical supply store.

## 2019-07-18 NOTE — Telephone Encounter (Addendum)
No problem, I am going to write a prescription for this and he can get it at any medical supply store.

## 2019-07-20 ENCOUNTER — Other Ambulatory Visit: Payer: Self-pay

## 2019-07-20 MED ORDER — CERVICAL COLLAR MISC: 0 refills | Status: DC

## 2019-07-24 ENCOUNTER — Ambulatory Visit (INDEPENDENT_AMBULATORY_CARE_PROVIDER_SITE_OTHER): Payer: BC Managed Care – PPO | Admitting: Sports Medicine

## 2019-07-24 DIAGNOSIS — M542 Cervicalgia: Secondary | ICD-10-CM
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
## 2019-07-31 ENCOUNTER — Ambulatory Visit (INDEPENDENT_AMBULATORY_CARE_PROVIDER_SITE_OTHER): Payer: BC Managed Care – PPO | Admitting: Sports Medicine

## 2019-07-31 DIAGNOSIS — M542 Cervicalgia: Secondary | ICD-10-CM | POA: Diagnosis not present
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
## 2019-08-04 ENCOUNTER — Other Ambulatory Visit: Payer: Self-pay | Admitting: Osteopathic Medicine

## 2019-08-20 ENCOUNTER — Encounter: Payer: No Typology Code available for payment source | Admitting: Osteopathic Medicine

## 2019-09-20 DIAGNOSIS — R600 Localized edema: Secondary | ICD-10-CM | POA: Diagnosis not present

## 2019-09-20 DIAGNOSIS — Z20822 Contact with and (suspected) exposure to covid-19: Secondary | ICD-10-CM | POA: Diagnosis not present

## 2019-09-20 DIAGNOSIS — R110 Nausea: Secondary | ICD-10-CM | POA: Diagnosis not present

## 2019-09-20 DIAGNOSIS — R519 Headache, unspecified: Secondary | ICD-10-CM | POA: Diagnosis not present

## 2019-09-20 DIAGNOSIS — R42 Dizziness and giddiness: Secondary | ICD-10-CM | POA: Diagnosis not present

## 2019-09-23 DIAGNOSIS — R079 Chest pain, unspecified: Secondary | ICD-10-CM | POA: Diagnosis not present

## 2019-10-09 ENCOUNTER — Telehealth: Payer: Self-pay | Admitting: Osteopathic Medicine

## 2019-10-09 NOTE — Telephone Encounter (Signed)
Left voicemail letting patient know this can be done tomorrow at visit.

## 2019-10-09 NOTE — Telephone Encounter (Signed)
Patient calling in wanting to know if he can get a TB test done tomorrow at his physical. States he has called the [facility] for Dr.Alexander but no response. Please advise.

## 2019-10-09 NOTE — Telephone Encounter (Signed)
Yes

## 2019-10-10 ENCOUNTER — Encounter: Payer: Self-pay | Admitting: Osteopathic Medicine

## 2019-10-10 ENCOUNTER — Ambulatory Visit (INDEPENDENT_AMBULATORY_CARE_PROVIDER_SITE_OTHER): Payer: BC Managed Care – PPO | Admitting: Osteopathic Medicine

## 2019-10-10 VITALS — BP 121/77 | HR 68 | Wt 222.0 lb

## 2019-10-10 DIAGNOSIS — Z1211 Encounter for screening for malignant neoplasm of colon: Secondary | ICD-10-CM

## 2019-10-10 DIAGNOSIS — I10 Essential (primary) hypertension: Secondary | ICD-10-CM

## 2019-10-10 DIAGNOSIS — Z0000 Encounter for general adult medical examination without abnormal findings: Secondary | ICD-10-CM | POA: Diagnosis not present

## 2019-10-10 DIAGNOSIS — Z111 Encounter for screening for respiratory tuberculosis: Secondary | ICD-10-CM | POA: Diagnosis not present

## 2019-10-10 DIAGNOSIS — Z23 Encounter for immunization: Secondary | ICD-10-CM | POA: Diagnosis not present

## 2019-10-10 DIAGNOSIS — E782 Mixed hyperlipidemia: Secondary | ICD-10-CM

## 2019-10-10 MED ORDER — ATORVASTATIN CALCIUM 20 MG PO TABS: 20.0000 mg | ORAL_TABLET | Freq: Every day | ORAL | 3 refills | Status: DC

## 2019-10-10 MED ORDER — AMLODIPINE BESYLATE 10 MG PO TABS: 10.0000 mg | ORAL_TABLET | Freq: Every day | ORAL | 3 refills | Status: DC

## 2019-10-10 NOTE — Progress Notes (Signed)
Joseph King is a 49 y.o. male who presents to  [facility] at [facility]  today, 10/10/19, seeking care for the following:  . Annual physical - no major issues, has stopped statin d/t concern for joint pains.      ASSESSMENT & PLAN with other pertinent findings:  The primary encounter diagnosis was Annual physical exam. Diagnoses of Essential hypertension, Mixed hyperlipidemia, Colon cancer screening, and PPD screening test were also pertinent to this visit.    --> Flu vax, Shingrix #2 --> GI referral fro colonoscopy --> labs as below, will see if we need to be back on statin --> PPD placed, RTC 2 days for nurse to read    Patient Instructions  General Preventive Care  Most recent routine screening labs: ordered today.   Blood pressure goal 130/80 or less.   Tobacco: don't!   Alcohol: responsible moderation is ok for most adults - if you have concerns about your alcohol intake, please talk to me!   Exercise: as tolerated to reduce risk of cardiovascular disease and diabetes. Strength training will also prevent osteoporosis.   Mental health: if need for mental health care (medicines, counseling, other), or concerns about moods, please let me know!   Sexual / Reproductive health: if need for STD testing, or if concerns with libido/pain problems, please let me know!   Advanced Directive: Living Will and/or Healthcare Power of Attorney recommended for all adults, regardless of age or health.  Vaccines  Flu vaccine: for almost everyone, every fall.   Shingles vaccine: after age 13.   Pneumonia vaccines: after age 39  Tetanus booster: every 10 years - due 01/2026  COVID vaccine: THANKS for getting your vaccine! :)  Cancer screenings   Colon cancer screening: for everyone age 39-75. Colonoscopy available for all, many people also qualify for the Cologuard stool test   Prostate cancer screening: PSA blood test age  38-71  Lung cancer screening: not needed for non-smokers  Infection screenings  . HIV: recommended screening at least once age 65-65 . Gonorrhea/Chlamydia: screening as needed . Hepatitis C: recommended once for everyone age 69-75 . TB: certain at-risk populations, or depending on work requirements and/or travel history Other . Bone Density Test: recommended for men at age 30 . Abdominal Aortic Aneurysm: screening with ultrasound recommended once for men age 22-75 who have ever smoked 100+ cigarettes in their lifetime    Orders Placed This Encounter  Procedures  . Varicella-zoster vaccine IM (Shingrix)  . Flu Vaccine QUAD 6+ mos PF IM (Fluarix Quad PF)  . CBC  . COMPLETE METABOLIC PANEL WITH GFR  . Lipid panel  . Ambulatory referral to Gastroenterology  . PPD    Meds ordered this encounter  Medications  . atorvastatin (LIPITOR) 20 MG tablet    Sig: Take 1 tablet (20 mg total) by mouth daily.    Dispense:  90 tablet    Refill:  3  . amLODipine (NORVASC) 10 MG tablet    Sig: Take 1 tablet (10 mg total) by mouth daily.    Dispense:  90 tablet    Refill:  3    Constitutional:  . VSS, see nurse notes . General Appearance: alert, well-developed, well-nourished, NAD Eyes: Marland Kitchen Normal lids and conjunctive, non-icteric sclera Neck: . No masses, trachea midline . No thyroid enlargement/tenderness/mass appreciated Respiratory: . Normal respiratory effort . Breath sounds normal, no wheeze/rhonchi/rales Cardiovascular: . S1/S2 normal, no murmur/rub/gallop auscultated, RRR . No lower extremity edema  Gastrointestinal: . Nontender, no masses . No hepatomegaly, no splenomegaly . No hernia appreciated Musculoskeletal:  . Gait normal . No clubbing/cyanosis of digits Neurological: . No cranial nerve deficit on limited exam . Motor and sensation intact and symmetric Psychiatric: . Normal judgment/insight . Normal mood and affect     Follow-up instructions: Return in about 1  year (around 10/09/2020) for [place] (call week prior to visit for lab orders).                                         BP 121/77 (BP Location: Left Arm, Patient Position: Sitting)   Pulse 68   Wt 222 lb (100.7 kg)   SpO2 100%   No outpatient medications have been marked as taking for the 10/10/19 encounter (Office Visit) with Sunnie Nielsen, DO.    No results found for this or any previous visit (from the past 72 hour(s)).  No results found.     All questions at time of visit were answered - patient instructed to contact office with any additional concerns or updates.  ER/RTC precautions were reviewed with the patient as applicable.   Please note: voice recognition software was used to produce this document, and typos may escape review. Please contact Dr. Lyn Hollingshead for any needed clarifications.

## 2019-10-10 NOTE — Patient Instructions (Signed)
General Preventive Care  Most recent routine screening labs: ordered today.   Blood pressure goal 130/80 or less.   Tobacco: don't!   Alcohol: responsible moderation is ok for most adults - if you have concerns about your alcohol intake, please talk to me!   Exercise: as tolerated to reduce risk of cardiovascular disease and diabetes. Strength training will also prevent osteoporosis.   Mental health: if need for mental health care (medicines, counseling, other), or concerns about moods, please let me know!   Sexual / Reproductive health: if need for STD testing, or if concerns with libido/pain problems, please let me know!   Advanced Directive: Living Will and/or Healthcare Power of Attorney recommended for all adults, regardless of age or health.  Vaccines  Flu vaccine: for almost everyone, every fall.   Shingles vaccine: after age 51.   Pneumonia vaccines: after age 77  Tetanus booster: every 10 years - due 01/2026  COVID vaccine: THANKS for getting your vaccine! :)  Cancer screenings   Colon cancer screening: for everyone age 47-75. Colonoscopy available for all, many people also qualify for the Cologuard stool test   Prostate cancer screening: PSA blood test age 6-71  Lung cancer screening: not needed for non-smokers  Infection screenings  . HIV: recommended screening at least once age 46-65 . Gonorrhea/Chlamydia: screening as needed . Hepatitis C: recommended once for everyone age 6-75 . TB: certain at-risk populations, or depending on work requirements and/or travel history Other . Bone Density Test: recommended for men at age 7 . Abdominal Aortic Aneurysm: screening with ultrasound recommended once for men age 50-75 who have ever smoked 100+ cigarettes in their lifetime

## 2019-10-10 NOTE — Progress Notes (Signed)
PPD screen placed in lower right arm. Patient tolerated injection well, advised to return within 72 hours.

## 2019-10-11 LAB — CBC
HCT: 43.1 % (ref 38.5–50.0)
Hemoglobin: 14.2 g/dL (ref 13.2–17.1)
MCH: 27.4 pg (ref 27.0–33.0)
MCHC: 32.9 g/dL (ref 32.0–36.0)
MCV: 83 fL (ref 80.0–100.0)
MPV: 11 fL (ref 7.5–12.5)
Platelets: 237 10*3/uL (ref 140–400)
RBC: 5.19 10*6/uL (ref 4.20–5.80)
RDW: 13.1 % (ref 11.0–15.0)
WBC: 4.3 10*3/uL (ref 3.8–10.8)

## 2019-10-11 LAB — COMPLETE METABOLIC PANEL WITH GFR
AG Ratio: 1.5 (calc) (ref 1.0–2.5)
ALT: 11 U/L (ref 9–46)
AST: 13 U/L (ref 10–35)
Albumin: 4.2 g/dL (ref 3.6–5.1)
Alkaline phosphatase (APISO): 38 U/L (ref 35–144)
BUN: 17 mg/dL (ref 7–25)
CO2: 27 mmol/L (ref 20–32)
Calcium: 9.3 mg/dL (ref 8.6–10.3)
Chloride: 107 mmol/L (ref 98–110)
Creat: 1.29 mg/dL (ref 0.70–1.33)
GFR, Est African American: 74 mL/min/{1.73_m2} (ref >=60)
GFR, Est Non African American: 64 mL/min/{1.73_m2} (ref >=60)
Globulin: 2.8 g/dL (calc) (ref 1.9–3.7)
Glucose, Bld: 70 mg/dL (ref 65–139)
Potassium: 4.6 mmol/L (ref 3.5–5.3)
Sodium: 139 mmol/L (ref 135–146)
Total Bilirubin: 0.6 mg/dL (ref 0.2–1.2)
Total Protein: 7 g/dL (ref 6.1–8.1)

## 2019-10-11 LAB — LIPID PANEL
Cholesterol: 186 mg/dL (ref <200)
HDL: 62 mg/dL (ref >=40)
LDL Cholesterol (Calc): 105 mg/dL (calc) — ABNORMAL HIGH
Non-HDL Cholesterol (Calc): 124 mg/dL (calc) (ref <130)
Total CHOL/HDL Ratio: 3 (calc) (ref <5.0)
Triglycerides: 96 mg/dL (ref <150)

## 2019-10-12 ENCOUNTER — Other Ambulatory Visit: Payer: Self-pay

## 2019-10-12 ENCOUNTER — Ambulatory Visit (INDEPENDENT_AMBULATORY_CARE_PROVIDER_SITE_OTHER): Payer: BC Managed Care – PPO | Admitting: Family Medicine

## 2019-10-12 VITALS — BP 135/78 | HR 77

## 2019-10-12 DIAGNOSIS — Z111 Encounter for screening for respiratory tuberculosis: Secondary | ICD-10-CM

## 2019-10-12 LAB — TB SKIN TEST
Induration: 0 mm
TB Skin Test: NEGATIVE

## 2019-10-12 NOTE — Progress Notes (Signed)
Patient is here for PPD read.  Results were negative.

## 2019-10-12 NOTE — Progress Notes (Signed)
Agree with documentation as above.   Cynethia Schindler, MD  

## 2019-10-15 ENCOUNTER — Encounter: Payer: Self-pay | Admitting: Osteopathic Medicine

## 2019-11-23 ENCOUNTER — Encounter: Payer: Self-pay | Admitting: Gastroenterology

## 2020-01-04 ENCOUNTER — Ambulatory Visit (AMBULATORY_SURGERY_CENTER): Payer: Self-pay

## 2020-01-04 ENCOUNTER — Other Ambulatory Visit: Payer: Self-pay

## 2020-01-04 VITALS — Ht 73.5 in | Wt 226.0 lb

## 2020-01-04 DIAGNOSIS — Z1211 Encounter for screening for malignant neoplasm of colon: Secondary | ICD-10-CM

## 2020-01-04 MED ORDER — CLENPIQ 10-3.5-12 MG-GM -GM/160ML PO SOLN: 1.0000 | Freq: Once | ORAL | 0 refills | Status: AC
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
## 2020-01-17 ENCOUNTER — Encounter: Payer: Self-pay | Admitting: Gastroenterology

## 2020-01-17 ENCOUNTER — Other Ambulatory Visit: Payer: Self-pay

## 2020-01-17 ENCOUNTER — Ambulatory Visit (AMBULATORY_SURGERY_CENTER): Payer: BC Managed Care – PPO | Admitting: Gastroenterology

## 2020-01-17 VITALS — BP 134/80 | HR 68 | Temp 97.7°F | Resp 15 | Ht 73.5 in | Wt 226.0 lb

## 2020-01-17 DIAGNOSIS — Z1211 Encounter for screening for malignant neoplasm of colon: Secondary | ICD-10-CM

## 2020-01-17 DIAGNOSIS — K641 Second degree hemorrhoids: Secondary | ICD-10-CM

## 2020-01-17 MED ORDER — SODIUM CHLORIDE 0.9 % IV SOLN: 500.0000 mL | INTRAVENOUS | Status: DC

## 2020-01-17 NOTE — Patient Instructions (Signed)
Handout given:  Hemorrhoids Continue current medications Repeat colonoscopy in 10 years !!!!! Hooray Use fiber, ir:  Citrucel, fibercon, or metamucil  YOU HAD AN ENDOSCOPIC PROCEDURE TODAY AT THE [facility]:   Refer to the procedure report that was given to you for any specific questions about what was found during the examination.  If the procedure report does not answer your questions, please call your gastroenterologist to clarify.  If you requested that your care partner not be given the details of your procedure findings, then the procedure report has been included in a sealed envelope for you to review at your convenience later.  YOU SHOULD EXPECT: Some feelings of bloating in the abdomen. Passage of more gas than usual.  Walking can help get rid of the air that was put into your GI tract during the procedure and reduce the bloating. If you had a lower endoscopy (such as a colonoscopy or flexible sigmoidoscopy) you may notice spotting of blood in your stool or on the toilet paper. If you underwent a bowel prep for your procedure, you may not have a normal bowel movement for a few days.  Please Note:  You might notice some irritation and congestion in your nose or some drainage.  This is from the oxygen used during your procedure.  There is no need for concern and it should clear up in a day or so.  SYMPTOMS TO REPORT IMMEDIATELY:   Following lower endoscopy (colonoscopy or flexible sigmoidoscopy):  Excessive amounts of blood in the stool  Significant tenderness or worsening of abdominal pains  Swelling of the abdomen that is new, acute  Fever of 100F or higher  For urgent or emergent issues, a gastroenterologist can be reached at any hour by calling [phone]. Do not use MyChart messaging for urgent concerns.   DIET:  We do recommend a small meal at first, but then you may proceed to your regular diet.  Drink plenty of fluids but you should avoid alcoholic beverages  for 24 hours.  ACTIVITY:  You should plan to take it easy for the rest of today and you should NOT DRIVE or use heavy machinery until tomorrow (because of the sedation medicines used during the test).    FOLLOW UP: Our staff will call the number listed on your records 48-72 hours following your procedure to check on you and address any questions or concerns that you may have regarding the information given to you following your procedure. If we do not reach you, we will leave a message.  We will attempt to reach you two times.  During this call, we will ask if you have developed any symptoms of COVID 19. If you develop any symptoms (ie: fever, flu-like symptoms, shortness of breath, cough etc.) before then, please call [phone].  If you test positive for Covid 19 in the 2 weeks post procedure, please call and report this information to [place].    If any biopsies were taken you will be contacted by phone or by letter within the next 1-3 weeks.  Please call us at [phone] if you have not heard about the biopsies in 3 weeks.   SIGNATURES/CONFIDENTIALITY: You and/or your care partner have signed paperwork which will be entered into your electronic medical record.  These signatures attest to the fact that that the information above on your After Visit Summary has been reviewed and is understood.  Full responsibility of the confidentiality of this discharge information lies with you and/or  your care-partner.

## 2020-01-17 NOTE — Progress Notes (Signed)
Pt's states no medical or surgical changes since previsit or office visit. 

## 2020-01-17 NOTE — Progress Notes (Signed)
A/ox3, pleased with MAC, report to RN 

## 2020-01-17 NOTE — Op Note (Signed)
[facility] Patient Name: Joseph King Procedure Date: 01/17/2020 9:47 AM MRN: [id number] Endoscopist: Doristine Locks , MD Age: 49 Referring MD:  Date of Birth: May 29, 1970 Gender: Male Account #: [id number] Procedure:                Colonoscopy Indications:              Screening for colorectal malignant neoplasm, This                            is the patient's first colonoscopy Medicines:                Monitored Anesthesia Care Procedure:                Pre-Anesthesia Assessment:                           - Prior to the procedure, a History and Physical                            was performed, and patient medications and                            allergies were reviewed. The patient's tolerance of                            previous anesthesia was also reviewed. The risks                            and benefits of the procedure and the sedation                            options and risks were discussed with the patient.                            All questions were answered, and informed consent                            was obtained. Prior Anticoagulants: The patient has                            taken no previous anticoagulant or antiplatelet                            agents. ASA Grade Assessment: II - A patient with                            mild systemic disease. After reviewing the risks                            and benefits, the patient was deemed in                            satisfactory condition to undergo the procedure.  After obtaining informed consent, the colonoscope                            was passed under direct vision. Throughout the                            procedure, the patient's blood pressure, pulse, and                            oxygen saturations were monitored continuously. The                            Olympus CF-HQ190L ([id number]) Colonoscope was                            introduced through the anus  and advanced to the the                            terminal ileum. The colonoscopy was performed                            without difficulty. The patient tolerated the                            procedure well. The quality of the bowel                            preparation was good. The terminal ileum, ileocecal                            valve, appendiceal orifice, and rectum were                            photographed. Scope In: 10:06:57 AM Scope Out: 10:21:24 AM Scope Withdrawal Time: 0 hours 11 minutes 51 seconds  Total Procedure Duration: 0 hours 14 minutes 27 seconds  Findings:                 Hemorrhoids were found on perianal exam.                           The colon (entire examined portion) appeared normal.                           Non-bleeding internal hemorrhoids were found during                            retroflexion. The hemorrhoids were medium-sized and                            Grade II (internal hemorrhoids that prolapse but                            reduce spontaneously).  The terminal ileum appeared normal. Complications:            No immediate complications. Estimated Blood Loss:     Estimated blood loss: none. Impression:               - Hemorrhoids found on perianal exam.                           - The entire examined colon is normal.                           - Non-bleeding internal hemorrhoids.                           - The examined portion of the ileum was normal.                           - No specimens collected. Recommendation:           - Patient has a contact number available for                            emergencies. The signs and symptoms of potential                            delayed complications were discussed with the                            patient. Return to normal activities tomorrow.                            Written discharge instructions were provided to the                            patient.                            - Resume previous diet.                           - Continue present medications.                           - Repeat colonoscopy in 10 years for screening                            purposes.                           - Return to GI office PRN.                           - Use fiber, for example Citrucel, Fibercon, Konsyl                            or Metamucil.                           -  Internal hemorrhoids were noted on this study and                            may be amenable to hemorrhoid band ligation. If you                            are interested in further treatment of these                            hemorrhoids with band ligation, please contact my                            clinic to set up an appointment for evaluation and                            treatment. Doristine LocksVito Tavi Gaughran, MD 01/17/2020 10:26:51 AM

## 2020-01-21 ENCOUNTER — Telehealth: Payer: Self-pay | Admitting: *Deleted

## 2020-01-21 NOTE — Telephone Encounter (Signed)
No answer for post procedure call back. Left message for patient to call with questions or concerns. 

## 2020-04-10 ENCOUNTER — Encounter: Payer: Self-pay | Admitting: Osteopathic Medicine

## 2020-04-10 ENCOUNTER — Other Ambulatory Visit: Payer: Self-pay

## 2020-04-10 ENCOUNTER — Ambulatory Visit (INDEPENDENT_AMBULATORY_CARE_PROVIDER_SITE_OTHER): Payer: BC Managed Care – PPO | Admitting: Osteopathic Medicine

## 2020-04-10 VITALS — BP 124/73 | HR 64 | Temp 97.9°F | Wt 232.0 lb

## 2020-04-10 DIAGNOSIS — Z0189 Encounter for other specified special examinations: Secondary | ICD-10-CM

## 2020-04-10 DIAGNOSIS — I10 Essential (primary) hypertension: Secondary | ICD-10-CM

## 2020-04-10 DIAGNOSIS — R21 Rash and other nonspecific skin eruption: Secondary | ICD-10-CM | POA: Diagnosis not present

## 2020-04-10 MED ORDER — CLOTRIMAZOLE-BETAMETHASONE 1-0.05 % EX CREA: 1.0000 "application " | TOPICAL_CREAM | Freq: Two times a day (BID) | CUTANEOUS | 1 refills | Status: DC

## 2020-04-10 NOTE — Progress Notes (Signed)
Joseph King is a 50 y.o. male who presents to  [facility] at [facility]  today, 04/10/20, seeking care for the following:  . Rash - small area of cracked skin on shaft of penis near glans, irritated btu no wider rash or other lesions/ulcers. Hsa been using neosporin and it's not improving. On exam, small skin crack, almost fissure, about 0.5 cm long. No erythema. No other lesions. Pt concerned for possibility of abnormal sugars. Marland Kitchen HLD - has been off lipitor d/t muscle aches. Off this for a few months, would like to check cholesterol off the Rx.      ASSESSMENT & PLAN with other pertinent findings:  The primary encounter diagnosis was Rash. Diagnoses of Patient requested diagnostic testing and Essential hypertension were also pertinent to this visit.    Trial steroid to help w/ irritation, antifungal in case of mild infection, doesn't appear c/w HSV at all. Low threshold for derm referral if no better.   Labs as below.     Orders Placed This Encounter  Procedures  . CBC  . COMPLETE METABOLIC PANEL WITH GFR  . Lipid panel  . Urinalysis with Culture Reflex    Meds ordered this encounter  Medications  . clotrimazole-betamethasone (LOTRISONE) cream    Sig: Apply 1 application topically 2 (two) times daily.    Dispense:  15 g    Refill:  1     See below for relevant physical exam findings  See below for recent lab and imaging results reviewed  Medications, allergies, PMH, PSH, SocH, FamH reviewed below    Follow-up instructions: Return for KEEP CURRENTLY SCHEDULED APPOINTMENT.                                        Exam:  BP 124/73 (BP Location: Left Arm, Patient Position: Sitting, Cuff Size: Normal)   Pulse 64   Temp 97.9 F (36.6 C) (Oral)   Wt 232 lb 0.6 oz (105.3 kg)   BMI 30.20 kg/m   Constitutional: VS see above. General Appearance: alert, well-developed,  well-nourished, NAD  Neck: No masses, trachea midline.   Respiratory: Normal respiratory effort.   Musculoskeletal: Gait normal.   Neurological: Normal balance/coordination. No tremor.  Skin: warm, dry, intact. See above  Psychiatric: Normal judgment/insight. Normal mood and affect. Oriented x3.   Current Meds  Medication Sig  . amLODipine (NORVASC) 10 MG tablet Take 1 tablet (10 mg total) by mouth daily.  . clotrimazole-betamethasone (LOTRISONE) cream Apply 1 application topically 2 (two) times daily.    No Known Allergies  Patient Active Problem List   Diagnosis Date Noted  . Pain of neck with recent traumatic injury 07/17/2019  . Other chest pain 08/03/2018  . Left foot pain 08/03/2018  . Essential hypertension 12/02/2017  . Frequent urination 12/02/2017  . Obesity (BMI 30-39.9) 06/21/2017    Family History  Problem Relation Age of Onset  . High blood pressure Mother   . Colon cancer Neg Hx   . Colon polyps Neg Hx   . Esophageal cancer Neg Hx   . Rectal cancer Neg Hx   . Stomach cancer Neg Hx     Social History   Tobacco Use  Smoking Status Never Smoker  Smokeless Tobacco Never Used    No past surgical history on file.  Immunization History  Administered Date(s) Administered  . Influenza,inj,Quad  PF,6+ Mos 10/10/2019  . Influenza-Unspecified 09/25/2017  . PFIZER(Purple Top)SARS-COV-2 Vaccination 04/11/2019, 05/02/2019  . PPD Test 10/10/2019  . Td 01/25/2001  . Tdap 02/04/2016  . Zoster Recombinat (Shingrix) 10/25/2016, 10/10/2019    No results found for this or any previous visit (from the past 2160 hour(s)).  No results found.     All questions at time of visit were answered - patient instructed to contact office with any additional concerns or updates. ER/RTC precautions were reviewed with the patient as applicable.   Please note: manual typing as well as voice recognition software may have been used to produce this document - typos may  escape review. Please contact Dr. Lyn Hollingshead for any needed clarifications.

## 2020-04-12 LAB — COMPLETE METABOLIC PANEL WITH GFR
AG Ratio: 1.5 (calc) (ref 1.0–2.5)
ALT: 12 U/L (ref 9–46)
AST: 14 U/L (ref 10–35)
Albumin: 4.2 g/dL (ref 3.6–5.1)
Alkaline phosphatase (APISO): 40 U/L (ref 35–144)
BUN: 14 mg/dL (ref 7–25)
CO2: 27 mmol/L (ref 20–32)
Calcium: 9 mg/dL (ref 8.6–10.3)
Chloride: 106 mmol/L (ref 98–110)
Creat: 1.19 mg/dL (ref 0.70–1.33)
GFR, Est African American: 81 mL/min/{1.73_m2} (ref >=60)
GFR, Est Non African American: 70 mL/min/{1.73_m2} (ref >=60)
Globulin: 2.8 g/dL (calc) (ref 1.9–3.7)
Glucose, Bld: 84 mg/dL (ref 65–99)
Potassium: 4.8 mmol/L (ref 3.5–5.3)
Sodium: 140 mmol/L (ref 135–146)
Total Bilirubin: 0.5 mg/dL (ref 0.2–1.2)
Total Protein: 7 g/dL (ref 6.1–8.1)

## 2020-04-12 LAB — URINALYSIS W MICROSCOPIC + REFLEX CULTURE
Bacteria, UA: NONE SEEN /HPF
Bilirubin Urine: NEGATIVE
Glucose, UA: NEGATIVE
Hgb urine dipstick: NEGATIVE
Hyaline Cast: NONE SEEN /LPF
Ketones, ur: NEGATIVE
Leukocyte Esterase: NEGATIVE
Nitrites, Initial: NEGATIVE
Protein, ur: NEGATIVE
RBC / HPF: NONE SEEN /HPF (ref 0–2)
Specific Gravity, Urine: 1.021 (ref 1.001–1.03)
Squamous Epithelial / HPF: NONE SEEN /HPF (ref <=5)
WBC, UA: NONE SEEN /HPF (ref 0–5)
pH: 6.5 (ref 5.0–8.0)

## 2020-04-12 LAB — CBC
HCT: 43.9 % (ref 38.5–50.0)
Hemoglobin: 14.7 g/dL (ref 13.2–17.1)
MCH: 27.4 pg (ref 27.0–33.0)
MCHC: 33.5 g/dL (ref 32.0–36.0)
MCV: 81.8 fL (ref 80.0–100.0)
MPV: 10.2 fL (ref 7.5–12.5)
Platelets: 227 10*3/uL (ref 140–400)
RBC: 5.37 10*6/uL (ref 4.20–5.80)
RDW: 12.8 % (ref 11.0–15.0)
WBC: 3.6 10*3/uL — ABNORMAL LOW (ref 3.8–10.8)

## 2020-04-12 LAB — LIPID PANEL
Cholesterol: 170 mg/dL (ref <200)
HDL: 65 mg/dL (ref >=40)
LDL Cholesterol (Calc): 92 mg/dL (calc)
Non-HDL Cholesterol (Calc): 105 mg/dL (calc) (ref <130)
Total CHOL/HDL Ratio: 2.6 (calc) (ref <5.0)
Triglycerides: 50 mg/dL (ref <150)

## 2020-04-12 LAB — NO CULTURE INDICATED
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
## 2020-10-09 ENCOUNTER — Encounter: Payer: Self-pay | Admitting: Osteopathic Medicine

## 2020-10-09 ENCOUNTER — Ambulatory Visit (INDEPENDENT_AMBULATORY_CARE_PROVIDER_SITE_OTHER): Payer: BC Managed Care – PPO | Admitting: Osteopathic Medicine

## 2020-10-09 VITALS — BP 121/76 | HR 71 | Temp 97.9°F | Ht 72.0 in | Wt 227.0 lb

## 2020-10-09 DIAGNOSIS — R350 Frequency of micturition: Secondary | ICD-10-CM

## 2020-10-09 DIAGNOSIS — I10 Essential (primary) hypertension: Secondary | ICD-10-CM | POA: Diagnosis not present

## 2020-10-09 DIAGNOSIS — Z0000 Encounter for general adult medical examination without abnormal findings: Secondary | ICD-10-CM | POA: Diagnosis not present

## 2020-10-09 DIAGNOSIS — E782 Mixed hyperlipidemia: Secondary | ICD-10-CM

## 2020-10-09 DIAGNOSIS — Z23 Encounter for immunization: Secondary | ICD-10-CM

## 2020-10-09 MED ORDER — AMLODIPINE BESYLATE 10 MG PO TABS: 10.0000 mg | ORAL_TABLET | Freq: Every day | ORAL | 3 refills | Status: DC

## 2020-10-09 NOTE — Progress Notes (Signed)
Joseph King is a 50 y.o. male who presents to  [facility] at [facility]  today, 10/09/20, seeking care for the following:  Annual physical Frequent urination ongoing >1 year, no dysuria     ASSESSMENT & PLAN with other pertinent findings:  The primary encounter diagnosis was Annual physical exam. Diagnoses of Need for influenza vaccination, Essential hypertension, Frequent urination, and Mixed hyperlipidemia were also pertinent to this visit.    Patient Instructions  General Preventive Care Most recent routine screening labs: ordered today.  Blood pressure goal 130/80 or less.  Tobacco: don't!  Alcohol: responsible moderation is ok for most adults - if you have concerns about your alcohol intake, please talk to me!  Exercise: as tolerated to reduce risk of cardiovascular disease and diabetes. Strength training will also prevent osteoporosis.  Mental health: if need for mental health care (medicines, counseling, other), or concerns about moods, please let me know!  Sexual / Reproductive health: if need for STD testing, or if concerns with libido/pain problems, please let me know!  Advanced Directive: Living Will and/or Healthcare Power of Attorney recommended for all adults, regardless of age or health.  Vaccines Flu vaccine: for almost everyone, every fall.  Shingles vaccine: all done!  Pneumonia vaccines: after age 84. Tetanus booster: due 2028 COVID vaccine: THANKS for getting your vaccine! :) FALL BOOSTER STRONGLY RECOMMENDED  Cancer screenings  Colon cancer screening: 12/2029 will be due Prostate cancer screening: PSA blood test age 46-71.  Lung cancer screening: not needed for non-smokers  Infection screenings  HIV: recommended screening at least once age 19-65, more often as needed. Gonorrhea/Chlamydia: screening as needed Hepatitis C: recommended once for everyone age 30-75 TB: certain at-risk populations, or  depending on work requirements and/or travel history Other Bone Density Test: recommended for men at age 60 Abdominal Aortic Aneurysm: screening with ultrasound recommended once for men age 74-75 who have ever smoked   Orders Placed This Encounter  Procedures   Flu Vaccine QUAD 53mo+IM (Fluarix, Fluzone & Alfiuria Quad PF)   CBC   COMPLETE METABOLIC PANEL WITH GFR   Lipid panel   Hemoglobin A1c   PSA, Total with Reflex to PSA, Free   Urinalysis, Routine w reflex microscopic    Meds ordered this encounter  Medications   amLODipine (NORVASC) 10 MG tablet    Sig: Take 1 tablet (10 mg total) by mouth daily.    Dispense:  90 tablet    Refill:  3     See below for relevant physical exam findings  See below for recent lab and imaging results reviewed  Medications, allergies, PMH, PSH, SocH, FamH reviewed below    Follow-up instructions: Return in about 1 year (around 10/09/2021) for ROUTINE ANNUAL PHYSICAL (OR SOONER IF NEEDED).                                        Exam:  BP 121/76   Pulse 71   Temp 97.9 F (36.6 C)   Ht 6' (1.829 m)   Wt 227 lb (103 kg)   SpO2 98%   BMI 30.79 kg/m  Constitutional: VS see above. General Appearance: alert, well-developed, well-nourished, NAD Neck: No masses, trachea midline.  Respiratory: Normal respiratory effort. no wheeze, no rhonchi, no rales Cardiovascular: S1/S2 normal, no murmur, no rub/gallop auscultated. RRR.  Musculoskeletal: Gait normal. Symmetric and independent movement  of all extremities Abdominal: non-tender, non-distended, no appreciable organomegaly, neg Murphy's, BS WNLx4 Neurological: Normal balance/coordination. No tremor. Skin: warm, dry, intact.  Psychiatric: Normal judgment/insight. Normal mood and affect. Oriented x3.   Current Meds  Medication Sig   clotrimazole-betamethasone (LOTRISONE) cream Apply 1 application topically 2 (two) times daily.   cyclobenzaprine (FLEXERIL)  10 MG tablet One half to one tab PO qHS, then increase gradually to one tab TID.   Elastic Bandages & Supports (CERVICAL COLLAR) MISC Soft cervical collar to be worn 18 hours a day   meloxicam (MOBIC) 15 MG tablet One tab PO qAM with a meal for 2 weeks, then daily prn pain.   [DISCONTINUED] amLODipine (NORVASC) 10 MG tablet Take 1 tablet (10 mg total) by mouth daily.    Not on File  Patient Active Problem List   Diagnosis Date Noted   Pain of neck with recent traumatic injury 07/17/2019   Other chest pain 08/03/2018   Left foot pain 08/03/2018   Essential hypertension 12/02/2017   Frequent urination 12/02/2017   Obesity (BMI 30-39.9) 06/21/2017    Family History  Problem Relation Age of Onset   High blood pressure Mother    Colon cancer Neg Hx    Colon polyps Neg Hx    Esophageal cancer Neg Hx    Rectal cancer Neg Hx    Stomach cancer Neg Hx     Social History   Tobacco Use  Smoking Status Never  Smokeless Tobacco Never    History reviewed. No pertinent surgical history.  Immunization History  Administered Date(s) Administered   Influenza,inj,Quad PF,6+ Mos 10/10/2019, 10/09/2020   Influenza-Unspecified 09/25/2017   PFIZER(Purple Top)SARS-COV-2 Vaccination 04/11/2019, 05/02/2019, 11/09/2019   PPD Test 10/10/2019   Td 01/25/2001   Tdap 02/04/2016   Zoster Recombinat (Shingrix) 10/25/2016, 10/10/2019    No results found for this or any previous visit (from the past 2160 hour(s)).  No results found.     All questions at time of visit were answered - patient instructed to contact office with any additional concerns or updates. ER/RTC precautions were reviewed with the patient as applicable.   Please note: manual typing as well as voice recognition software may have been used to produce this document - typos may escape review. Please contact Dr. Lyn Hollingshead for any needed clarifications.

## 2020-10-09 NOTE — Patient Instructions (Addendum)
General Preventive Care Most recent routine screening labs: ordered today.  Blood pressure goal 130/80 or less.  Tobacco: don't!  Alcohol: responsible moderation is ok for most adults - if you have concerns about your alcohol intake, please talk to me!  Exercise: as tolerated to reduce risk of cardiovascular disease and diabetes. Strength training will also prevent osteoporosis.  Mental health: if need for mental health care (medicines, counseling, other), or concerns about moods, please let me know!  Sexual / Reproductive health: if need for STD testing, or if concerns with libido/pain problems, please let me know!  Advanced Directive: Living Will and/or Healthcare Power of Attorney recommended for all adults, regardless of age or health.  Vaccines Flu vaccine: for almost everyone, every fall.  Shingles vaccine: all done!  Pneumonia vaccines: after age 70. Tetanus booster: due 2028 COVID vaccine: THANKS for getting your vaccine! :) FALL BOOSTER STRONGLY RECOMMENDED  Cancer screenings  Colon cancer screening: 12/2029 will be due Prostate cancer screening: PSA blood test age 67-71.  Lung cancer screening: not needed for non-smokers  Infection screenings  HIV: recommended screening at least once age 50-65, more often as needed. Gonorrhea/Chlamydia: screening as needed Hepatitis C: recommended once for everyone age 64-75 TB: certain at-risk populations, or depending on work requirements and/or travel history Other Bone Density Test: recommended for men at age 51 Abdominal Aortic Aneurysm: screening with ultrasound recommended once for men age 34-75 who have ever smoked

## 2020-10-13 LAB — COMPLETE METABOLIC PANEL WITH GFR
AG Ratio: 1.4 (calc) (ref 1.0–2.5)
ALT: 10 U/L (ref 9–46)
AST: 15 U/L (ref 10–35)
Albumin: 4.2 g/dL (ref 3.6–5.1)
Alkaline phosphatase (APISO): 43 U/L (ref 35–144)
BUN/Creatinine Ratio: 12 (calc) (ref 6–22)
BUN: 16 mg/dL (ref 7–25)
CO2: 28 mmol/L (ref 20–32)
Calcium: 9.2 mg/dL (ref 8.6–10.3)
Chloride: 107 mmol/L (ref 98–110)
Creat: 1.33 mg/dL — ABNORMAL HIGH (ref 0.70–1.30)
Globulin: 3 g/dL (calc) (ref 1.9–3.7)
Glucose, Bld: 90 mg/dL (ref 65–99)
Potassium: 4.3 mmol/L (ref 3.5–5.3)
Sodium: 140 mmol/L (ref 135–146)
Total Bilirubin: 0.9 mg/dL (ref 0.2–1.2)
Total Protein: 7.2 g/dL (ref 6.1–8.1)
eGFR: 64 mL/min/{1.73_m2} (ref >=60)

## 2020-10-13 LAB — URINALYSIS, ROUTINE W REFLEX MICROSCOPIC
Bilirubin Urine: NEGATIVE
Glucose, UA: NEGATIVE
Hgb urine dipstick: NEGATIVE
Ketones, ur: NEGATIVE
Leukocytes,Ua: NEGATIVE
Nitrite: NEGATIVE
Protein, ur: NEGATIVE
Specific Gravity, Urine: 1.021 (ref 1.001–1.035)
pH: 6.5 (ref 5.0–8.0)

## 2020-10-13 LAB — CBC
HCT: 44.4 % (ref 38.5–50.0)
Hemoglobin: 14.5 g/dL (ref 13.2–17.1)
MCH: 26.8 pg — ABNORMAL LOW (ref 27.0–33.0)
MCHC: 32.7 g/dL (ref 32.0–36.0)
MCV: 81.9 fL (ref 80.0–100.0)
MPV: 10.5 fL (ref 7.5–12.5)
Platelets: 222 10*3/uL (ref 140–400)
RBC: 5.42 10*6/uL (ref 4.20–5.80)
RDW: 13 % (ref 11.0–15.0)
WBC: 3.2 10*3/uL — ABNORMAL LOW (ref 3.8–10.8)

## 2020-10-13 LAB — LIPID PANEL
Cholesterol: 180 mg/dL (ref <200)
HDL: 61 mg/dL (ref >=40)
LDL Cholesterol (Calc): 107 mg/dL (calc) — ABNORMAL HIGH
Non-HDL Cholesterol (Calc): 119 mg/dL (calc) (ref <130)
Total CHOL/HDL Ratio: 3 (calc) (ref <5.0)
Triglycerides: 40 mg/dL (ref <150)

## 2020-10-13 LAB — HEMOGLOBIN A1C
Hgb A1c MFr Bld: 5.6 % of total Hgb (ref <5.7)
Mean Plasma Glucose: 114 mg/dL
eAG (mmol/L): 6.3 mmol/L

## 2020-10-13 LAB — PSA, TOTAL WITH REFLEX TO PSA, FREE: PSA, Total: 1.2 ng/mL (ref <=4.0)

## 2021-06-01 DIAGNOSIS — R0789 Other chest pain: Secondary | ICD-10-CM | POA: Diagnosis not present

## 2021-06-01 DIAGNOSIS — I441 Atrioventricular block, second degree: Secondary | ICD-10-CM | POA: Diagnosis not present

## 2021-06-01 DIAGNOSIS — R7303 Prediabetes: Secondary | ICD-10-CM | POA: Insufficient documentation

## 2021-06-01 DIAGNOSIS — Z7689 Persons encountering health services in other specified circumstances: Secondary | ICD-10-CM | POA: Diagnosis not present

## 2021-06-01 DIAGNOSIS — I10 Essential (primary) hypertension: Secondary | ICD-10-CM | POA: Diagnosis not present

## 2021-06-01 DIAGNOSIS — R350 Frequency of micturition: Secondary | ICD-10-CM | POA: Diagnosis not present

## 2021-06-01 DIAGNOSIS — Z23 Encounter for immunization: Secondary | ICD-10-CM | POA: Diagnosis not present

## 2021-06-01 DIAGNOSIS — R002 Palpitations: Secondary | ICD-10-CM | POA: Diagnosis not present

## 2021-06-22 DIAGNOSIS — I441 Atrioventricular block, second degree: Secondary | ICD-10-CM | POA: Diagnosis not present

## 2021-06-22 DIAGNOSIS — I491 Atrial premature depolarization: Secondary | ICD-10-CM | POA: Diagnosis not present

## 2021-10-12 ENCOUNTER — Encounter: Payer: BC Managed Care – PPO | Admitting: Family Medicine

## 2022-01-27 ENCOUNTER — Telehealth: Payer: Self-pay | Admitting: Family Medicine

## 2022-01-27 NOTE — Telephone Encounter (Signed)
Changed the appointment type from OV to New Patient per provider direction. Left notes as they were prior to change of appointment type. Buel Ream

## 2022-01-27 NOTE — Telephone Encounter (Signed)
This would be considered a new patient , not transfer of care since provider is no longer at [facility]. Please change to new Patient visit. Visits like these have already been discussed with Ms Raymondo Band.

## 2022-01-27 NOTE — Telephone Encounter (Signed)
Noted and aware.

## 2022-01-27 NOTE — Telephone Encounter (Signed)
Pt  called in needing to schedule a transfer of care appt. He is a former pt of Dr.Alexander who is no longer here. He is also needing a refill on amlodipine 10mg  tablets. [facility] in [place] is the correct pharmacy.

## 2022-02-03 ENCOUNTER — Encounter: Payer: Self-pay | Admitting: Family Medicine

## 2022-02-03 ENCOUNTER — Ambulatory Visit (INDEPENDENT_AMBULATORY_CARE_PROVIDER_SITE_OTHER): Payer: No Typology Code available for payment source | Admitting: Family Medicine

## 2022-02-03 VITALS — BP 143/82 | HR 66 | Temp 97.9°F | Ht 73.0 in | Wt 232.0 lb

## 2022-02-03 DIAGNOSIS — Z0000 Encounter for general adult medical examination without abnormal findings: Secondary | ICD-10-CM | POA: Diagnosis not present

## 2022-02-03 DIAGNOSIS — R7302 Impaired glucose tolerance (oral): Secondary | ICD-10-CM | POA: Diagnosis not present

## 2022-02-03 DIAGNOSIS — Z1159 Encounter for screening for other viral diseases: Secondary | ICD-10-CM

## 2022-02-03 DIAGNOSIS — Z125 Encounter for screening for malignant neoplasm of prostate: Secondary | ICD-10-CM

## 2022-02-03 DIAGNOSIS — Z13220 Encounter for screening for lipoid disorders: Secondary | ICD-10-CM

## 2022-02-03 DIAGNOSIS — Z136 Encounter for screening for cardiovascular disorders: Secondary | ICD-10-CM

## 2022-02-03 DIAGNOSIS — I10 Essential (primary) hypertension: Secondary | ICD-10-CM | POA: Diagnosis not present

## 2022-02-03 DIAGNOSIS — Z7689 Persons encountering health services in other specified circumstances: Secondary | ICD-10-CM

## 2022-02-03 MED ORDER — AMLODIPINE BESYLATE 10 MG PO TABS: 10.0000 mg | ORAL_TABLET | Freq: Every day | ORAL | 3 refills | Status: DC

## 2022-02-03 NOTE — Progress Notes (Signed)
New Patient Office Visit/Physical  Subjective    Patient ID: Joseph King, male    DOB: 06-Jul-1970  Age: 52 y.o. MRN: [id number]  CC:  Chief Complaint  Patient presents with   New Patient (Initial Visit)    Patient in office to est PCP -requesting rx rf of BP medication- patient will return for physical exam    Annual Exam    HPI Joseph King presents to establish care and annual. Fasting today and would like to get labs done.  Up to date with annual eye and dental exams. Up to date with vaccines and colonoscopy. Only taking Amlodipine 10mg  daily for HTN.  Outpatient Encounter Medications as of 02/03/2022  Medication Sig   [DISCONTINUED] amLODipine (NORVASC) 10 MG tablet Take 1 tablet (10 mg total) by mouth daily.   [DISCONTINUED] cyclobenzaprine (FLEXERIL) 10 MG tablet One half to one tab PO qHS, then increase gradually to one tab TID.   amLODipine (NORVASC) 10 MG tablet Take 1 tablet (10 mg total) by mouth daily.   [DISCONTINUED] atorvastatin (LIPITOR) 20 MG tablet Take 1 tablet (20 mg total) by mouth daily. (Patient not taking: Reported on 01/04/2020)   [DISCONTINUED] clotrimazole-betamethasone (LOTRISONE) cream Apply 1 application topically 2 (two) times daily. (Patient not taking: Reported on 02/03/2022)   [DISCONTINUED] Elastic Bandages & Supports (CERVICAL COLLAR) MISC Soft cervical collar to be worn 18 hours a day   [DISCONTINUED] meloxicam (MOBIC) 15 MG tablet One tab PO qAM with a meal for 2 weeks, then daily prn pain. (Patient not taking: Reported on 02/03/2022)   No facility-administered encounter medications on file as of 02/03/2022.    Past Medical History:  Diagnosis Date   High blood pressure    Hyperlipidemia     Past Surgical History:  Procedure Laterality Date   VASECTOMY      Family History  Problem Relation Age of Onset   High blood pressure Mother    Hypertension Sister    Breast cancer Maternal Aunt    Prostate cancer  Maternal Uncle    Diabetes Maternal Grandmother    Stroke Maternal Grandmother    Colon cancer Neg Hx    Colon polyps Neg Hx    Esophageal cancer Neg Hx    Rectal cancer Neg Hx    Stomach cancer Neg Hx     Social History   Socioeconomic History   Marital status: Married    Spouse name: gwenn   Number of children: 3   Years of education: Not on file   Highest education level: Not on file  Occupational History   Occupation: Surveyor, 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, contracting  Tobacco Use   Smoking status: Never   Smokeless tobacco: Never  Vaping Use   Vaping Use: Never used  Substance and Sexual Activity   Alcohol use: Never   Drug use: Never   Sexual activity: Yes    Partners: Female    Birth control/protection: Surgical  Other Topics Concern   Not on file  Social History Narrative   Not on file   Social Determinants of Health   Financial Resource Strain: Not on file  Food Insecurity: Not on file  Transportation Needs: Not on file  Physical Activity: Not on file  Stress: Not on file  Social Connections: Not on file  Intimate Partner Violence: Not on file    Review of Systems  All other systems reviewed and are negative.  Objective    BP (!) 143/82   Pulse 66   Temp 97.9 F (36.6 C)   Ht 6\' 1"  (1.854 m)   Wt 232 lb (105.2 kg)   SpO2 98%   BMI 30.61 kg/m   Physical Exam Vitals and nursing note reviewed.  Constitutional:      Appearance: Normal appearance. He is normal weight.  HENT:     Head: Normocephalic and atraumatic.     Right Ear: Tympanic membrane, ear canal and external ear normal.     Left Ear: Tympanic membrane, ear canal and external ear normal.     Nose: Nose normal.     Mouth/Throat:     Mouth: Mucous membranes are moist.     Pharynx: Oropharynx is clear.  Eyes:     Conjunctiva/sclera: Conjunctivae normal.     Pupils: Pupils are equal, round, and reactive to light.  Cardiovascular:     Rate and  Rhythm: Normal rate and regular rhythm.     Pulses: Normal pulses.     Heart sounds: Normal heart sounds.  Pulmonary:     Effort: Pulmonary effort is normal.     Breath sounds: Normal breath sounds.  Abdominal:     General: Abdomen is flat. Bowel sounds are normal.  Musculoskeletal:        General: Normal range of motion.  Skin:    General: Skin is warm.     Capillary Refill: Capillary refill takes less than 2 seconds.  Neurological:     General: No focal deficit present.     Mental Status: He is alert and oriented to person, place, and time. Mental status is at baseline.  Psychiatric:        Mood and Affect: Mood normal.        Behavior: Behavior normal.        Thought Content: Thought content normal.        Judgment: Judgment normal.        Assessment & Plan:   Problem List Items Addressed This Visit       Cardiovascular and Mediastinum   Essential hypertension   Relevant Medications   amLODipine (NORVASC) 10 MG tablet   Other Visit Diagnoses     Annual physical exam    -  Primary   Impaired glucose tolerance       Relevant Orders   CBC with Differential/Platelet   Comprehensive metabolic panel   Hemoglobin A1c   Encounter for lipid screening for cardiovascular disease       Relevant Orders   Lipid panel   Screening for viral disease       Relevant Orders   HIV Antibody (routine testing w rflx)   Need for hepatitis C screening test       Relevant Orders   Hepatitis C antibody   Screening PSA (prostate specific antigen)       Relevant Orders   PSA     Refilled Amlodipine 10mg  daily for the year. Blood pressure at goal. No complaints today Screening annual labs  No follow-ups on file.   Leeanne Rio, MD

## 2022-02-04 LAB — CBC WITH DIFFERENTIAL/PLATELET
Basophils Absolute: 0 10*3/uL (ref 0.0–0.2)
Basos: 1 %
EOS (ABSOLUTE): 0.1 10*3/uL (ref 0.0–0.4)
Eos: 2 %
Hematocrit: 42.6 % (ref 37.5–51.0)
Hemoglobin: 13.9 g/dL (ref 13.0–17.7)
Immature Grans (Abs): 0 10*3/uL (ref 0.0–0.1)
Immature Granulocytes: 0 %
Lymphocytes Absolute: 1.1 10*3/uL (ref 0.7–3.1)
Lymphs: 30 %
MCH: 26.9 pg (ref 26.6–33.0)
MCHC: 32.6 g/dL (ref 31.5–35.7)
MCV: 82 fL (ref 79–97)
Monocytes Absolute: 0.3 10*3/uL (ref 0.1–0.9)
Monocytes: 8 %
Neutrophils Absolute: 2.2 10*3/uL (ref 1.4–7.0)
Neutrophils: 59 %
Platelets: 230 10*3/uL (ref 150–450)
RBC: 5.17 x10E6/uL (ref 4.14–5.80)
RDW: 13 % (ref 11.6–15.4)
WBC: 3.7 10*3/uL (ref 3.4–10.8)

## 2022-02-04 LAB — COMPREHENSIVE METABOLIC PANEL
ALT: 12 IU/L (ref 0–44)
AST: 16 IU/L (ref 0–40)
Albumin/Globulin Ratio: 1.3 (ref 1.2–2.2)
Albumin: 4.1 g/dL (ref 3.8–4.9)
Alkaline Phosphatase: 55 IU/L (ref 44–121)
BUN/Creatinine Ratio: 12 (ref 9–20)
BUN: 17 mg/dL (ref 6–24)
Bilirubin Total: 0.7 mg/dL (ref 0.0–1.2)
CO2: 24 mmol/L (ref 20–29)
Calcium: 9.4 mg/dL (ref 8.7–10.2)
Chloride: 108 mmol/L — ABNORMAL HIGH (ref 96–106)
Creatinine, Ser: 1.44 mg/dL — ABNORMAL HIGH (ref 0.76–1.27)
Globulin, Total: 3.1 g/dL (ref 1.5–4.5)
Glucose: 86 mg/dL (ref 70–99)
Potassium: 4.4 mmol/L (ref 3.5–5.2)
Sodium: 143 mmol/L (ref 134–144)
Total Protein: 7.2 g/dL (ref 6.0–8.5)
eGFR: 58 mL/min/{1.73_m2} — ABNORMAL LOW (ref >59)

## 2022-02-04 LAB — HEMOGLOBIN A1C
Est. average glucose Bld gHb Est-mCnc: 114 mg/dL
Hgb A1c MFr Bld: 5.6 % (ref 4.8–5.6)

## 2022-02-04 LAB — LIPID PANEL
Chol/HDL Ratio: 3 ratio (ref 0.0–5.0)
Cholesterol, Total: 192 mg/dL (ref 100–199)
HDL: 64 mg/dL (ref >39)
LDL Chol Calc (NIH): 120 mg/dL — ABNORMAL HIGH (ref 0–99)
Triglycerides: 44 mg/dL (ref 0–149)
VLDL Cholesterol Cal: 8 mg/dL (ref 5–40)

## 2022-02-04 LAB — HEPATITIS C ANTIBODY: Hep C Virus Ab: NONREACTIVE

## 2022-02-04 LAB — HIV ANTIBODY (ROUTINE TESTING W REFLEX): HIV Screen 4th Generation wRfx: NONREACTIVE

## 2022-02-04 LAB — PSA: Prostate Specific Ag, Serum: 1.1 ng/mL (ref 0.0–4.0)

## 2022-03-26 ENCOUNTER — Other Ambulatory Visit: Payer: Self-pay | Admitting: Pharmacist

## 2022-03-26 NOTE — Progress Notes (Signed)
Patient appearing on report for [facility] - Hypertension Control report due to last documented ambulatory blood pressure of 143/82 on 02/03/22. Next appointment with PCP is 02/08/23   Outreached patient to discuss hypertension control and medication management. Left voicemail for patient to return my call at their convenience.   Larinda Buttery, PharmD Clinical Pharmacist [facility] [phone]

## 2022-03-30 ENCOUNTER — Ambulatory Visit: Payer: BC Managed Care – PPO

## 2022-04-02 ENCOUNTER — Ambulatory Visit (INDEPENDENT_AMBULATORY_CARE_PROVIDER_SITE_OTHER): Payer: No Typology Code available for payment source | Admitting: Family Medicine

## 2022-04-02 VITALS — BP 129/82 | HR 80 | Wt 231.9 lb

## 2022-04-02 DIAGNOSIS — I10 Essential (primary) hypertension: Secondary | ICD-10-CM

## 2022-04-02 NOTE — Progress Notes (Unsigned)
   Subjective:    Patient ID: Joseph King, male    DOB: November 14, 1970, 52 y.o.   MRN: [id number]  HPI He is here today for a Blood pressure check. He denies headache, dizziness, chest pain, SOB, leg swelling or visual changes.    Review of Systems     Objective:   Physical Exam        Assessment & Plan:  His blood pressure today is 129/82. He denies refills today. He was told to continue taking Amlodipine 10 mg as directed by Dr. Huel Cote, and follow up as needed. He has an annual visit already scheduled for next year.

## 2022-07-07 DIAGNOSIS — R400 Somnolence: Secondary | ICD-10-CM | POA: Insufficient documentation

## 2022-07-07 DIAGNOSIS — R0683 Snoring: Secondary | ICD-10-CM | POA: Insufficient documentation

## 2023-02-08 ENCOUNTER — Encounter: Payer: Self-pay | Admitting: Family Medicine

## 2023-02-08 ENCOUNTER — Ambulatory Visit (INDEPENDENT_AMBULATORY_CARE_PROVIDER_SITE_OTHER): Payer: BC Managed Care – PPO | Admitting: Family Medicine

## 2023-02-08 VITALS — BP 130/76 | HR 71 | Temp 97.7°F | Resp 18 | Ht 73.0 in | Wt 239.7 lb

## 2023-02-08 DIAGNOSIS — Z0000 Encounter for general adult medical examination without abnormal findings: Secondary | ICD-10-CM | POA: Diagnosis not present

## 2023-02-08 DIAGNOSIS — Z136 Encounter for screening for cardiovascular disorders: Secondary | ICD-10-CM

## 2023-02-08 DIAGNOSIS — R7302 Impaired glucose tolerance (oral): Secondary | ICD-10-CM

## 2023-02-08 DIAGNOSIS — Z125 Encounter for screening for malignant neoplasm of prostate: Secondary | ICD-10-CM | POA: Diagnosis not present

## 2023-02-08 DIAGNOSIS — Z13220 Encounter for screening for lipoid disorders: Secondary | ICD-10-CM

## 2023-02-08 NOTE — Progress Notes (Signed)
 Complete physical exam  Patient: Joseph King   DOB: 01-23-1971   53 y.o. Male  MRN: [id number]  Subjective:    Chief Complaint  Patient presents with   Annual Exam    Patient is here for his annual physical, patient is fasting    Joseph King is a 53 y.o. male who presents today for a complete physical exam. He reports consuming a general diet.  Exercising a few times a week.  He generally feels well. He reports sleeping fairly well. He does not have additional problems to discuss today.    Most recent fall risk assessment:    02/03/2022   10:48 AM  Fall Risk   Falls in the past year? 0  Number falls in past yr: 0  Injury with Fall? 0  Risk for fall due to : No Fall Risks  Follow up Falls evaluation completed     Most recent depression screenings:    02/03/2022   10:48 AM 10/09/2020    1:38 PM  PHQ 2/9 Scores  PHQ - 2 Score 0 0  PHQ- 9 Score 0     Vision:Within last year  Patient Active Problem List   Diagnosis Date Noted   Snoring 07/07/2022   Daytime somnolence 07/07/2022   Prediabetes 06/01/2021   Pain of neck with recent traumatic injury 07/17/2019   Other chest pain 08/03/2018   Left foot pain 08/03/2018   Essential hypertension 12/02/2017   Frequent urination 12/02/2017   Obesity (BMI 30-39.9) 06/21/2017   Past Medical History:  Diagnosis Date   High blood pressure    Hyperlipidemia    Past Surgical History:  Procedure Laterality Date   VASECTOMY     Social History   Socioeconomic History   Marital status: Married    Spouse name: gwenn   Number of children: 3   Years of education: Not on file   Highest education level: Not on file  Occupational History   Occupation: Copywriter, Advertising: [employer]    Comment: Veterinary Surgeon  Tobacco Use   Smoking status: Never    Passive exposure: Never   Smokeless tobacco: Never  Vaping Use   Vaping status: Never Used  Substance and Sexual Activity   Alcohol use:  Never   Drug use: Never   Sexual activity: Yes    Partners: Female    Birth control/protection: Surgical  Other Topics Concern   Not on file  Social History Narrative   Not on file   Social Drivers of Health   Financial Resource Strain: Not on file  Food Insecurity: Not on file  Transportation Needs: Not on file  Physical Activity: Not on file  Stress: Not on file  Social Connections: Unknown (06/08/2021)   Received from [facility], [facility]   Social Network    Social Network: Not on file  Intimate Partner Violence: Unknown (04/30/2021)   Received from [facility], [facility]   HITS    Physically Hurt: Not on file    Insult or Talk Down To: Not on file    Threaten Physical Harm: Not on file    Scream or Curse: Not on file   Family History  Problem Relation Age of Onset   High blood pressure Mother    Hypertension Sister    Breast cancer Maternal Aunt    Prostate cancer Maternal Uncle    Diabetes Maternal Grandmother    Stroke Maternal Grandmother    Colon cancer  Neg Hx    Colon polyps Neg Hx    Esophageal cancer Neg Hx    Rectal cancer Neg Hx    Stomach cancer Neg Hx    No Known Allergies    Patient Care Team: Colette Torrence GRADE, MD as PCP - General (Family Medicine)   Outpatient Medications Prior to Visit  Medication Sig   amLODipine  (NORVASC ) 10 MG tablet Take 1 tablet (10 mg total) by mouth daily.   No facility-administered medications prior to visit.    Review of Systems  Genitourinary:  Positive for frequency.  All other systems reviewed and are negative.        Objective:     BP 130/76   Pulse 71   Temp 97.7 F (36.5 C) (Oral)   Resp 18   Ht 6' 1 (1.854 m)   Wt 239 lb 11.2 oz (108.7 kg)   SpO2 98%   BMI 31.62 kg/m  BP Readings from Last 3 Encounters:  02/08/23 130/76  04/02/22 129/82  02/03/22 (!) 143/82      Physical Exam Vitals and nursing note reviewed.  Constitutional:      Appearance: Normal appearance. He is  normal weight.  HENT:     Head: Normocephalic and atraumatic.     Right Ear: Tympanic membrane, ear canal and external ear normal.     Left Ear: Tympanic membrane, ear canal and external ear normal.     Nose: Nose normal.     Mouth/Throat:     Mouth: Mucous membranes are moist.     Pharynx: Oropharynx is clear.  Eyes:     Conjunctiva/sclera: Conjunctivae normal.     Pupils: Pupils are equal, round, and reactive to light.  Cardiovascular:     Rate and Rhythm: Normal rate and regular rhythm.     Pulses: Normal pulses.     Heart sounds: Normal heart sounds.  Pulmonary:     Effort: Pulmonary effort is normal.     Breath sounds: Normal breath sounds.  Abdominal:     General: Abdomen is flat. Bowel sounds are normal.  Skin:    General: Skin is warm.     Capillary Refill: Capillary refill takes less than 2 seconds.  Neurological:     General: No focal deficit present.     Mental Status: He is alert and oriented to person, place, and time. Mental status is at baseline.  Psychiatric:        Mood and Affect: Mood normal.        Behavior: Behavior normal.        Thought Content: Thought content normal.        Judgment: Judgment normal.     No results found for any visits on 02/08/23. Last CBC Lab Results  Component Value Date   WBC 3.7 02/03/2022   HGB 13.9 02/03/2022   HCT 42.6 02/03/2022   MCV 82 02/03/2022   MCH 26.9 02/03/2022   RDW 13.0 02/03/2022   PLT 230 02/03/2022   Last metabolic panel Lab Results  Component Value Date   GLUCOSE 86 02/03/2022   NA 143 02/03/2022   K 4.4 02/03/2022   CL 108 (H) 02/03/2022   CO2 24 02/03/2022   BUN 17 02/03/2022   CREATININE 1.44 (H) 02/03/2022   EGFR 58 (L) 02/03/2022   CALCIUM  9.4 02/03/2022   PROT 7.2 02/03/2022   ALBUMIN 4.1 02/03/2022   LABGLOB 3.1 02/03/2022   AGRATIO 1.3 02/03/2022   BILITOT 0.7 02/03/2022   ALKPHOS  55 02/03/2022   AST 16 02/03/2022   ALT 12 02/03/2022   Last lipids Lab Results  Component Value  Date   CHOL 192 02/03/2022   HDL 64 02/03/2022   LDLCALC 120 (H) 02/03/2022   TRIG 44 02/03/2022   CHOLHDL 3.0 02/03/2022   Last hemoglobin A1c Lab Results  Component Value Date   HGBA1C 5.6 02/03/2022        Assessment & Plan:    Routine Health Maintenance and Physical Exam  Immunization History  Administered Date(s) Administered   Influenza,inj,Quad PF,6+ Mos 10/10/2019, 10/09/2020   Influenza-Unspecified 09/25/2017, 12/10/2021   PFIZER(Purple Top)SARS-COV-2 Vaccination 04/11/2019, 05/02/2019, 11/09/2019, 01/23/2020   PPD Test 10/10/2019   Pfizer Covid-19 Vaccine Bivalent Booster 13yrs & up 10/16/2020, 06/01/2021   Td 01/25/2001   Tdap 02/04/2016   Tetanus 01/25/2001   Zoster Recombinant(Shingrix) 10/25/2016, 10/10/2019    Health Maintenance  Topic Date Due   INFLUENZA VACCINE  08/26/2022   COVID-19 Vaccine (7 - 2024-25 season) 09/26/2022   DTaP/Tdap/Td (4 - Td or Tdap) 02/03/2026   Colonoscopy  01/16/2030   Hepatitis C Screening  Completed   HIV Screening  Completed   Zoster Vaccines- Shingrix  Completed   HPV VACCINES  Aged Out    Discussed health benefits of physical activity, and encouraged him to engage in regular exercise appropriate for his age and condition.  Problem List Items Addressed This Visit   None Visit Diagnoses       Annual physical exam    -  Primary     Impaired glucose tolerance         Encounter for lipid screening for cardiovascular disease         Screening PSA (prostate specific antigen)          No follow-ups on file. Annual physical exam  Impaired glucose tolerance -     CBC with Differential/Platelet -     Comprehensive metabolic panel -     Hemoglobin A1c  Encounter for lipid screening for cardiovascular disease -     Lipid panel  Screening PSA (prostate specific antigen) -     PSA   Screening labs See in 1 year sooner prn    Torrence CINDERELLA Barrier, MD

## 2023-02-09 LAB — COMPREHENSIVE METABOLIC PANEL
ALT: 19 [IU]/L (ref 0–44)
AST: 20 [IU]/L (ref 0–40)
Albumin: 4.1 g/dL (ref 3.8–4.9)
Alkaline Phosphatase: 53 [IU]/L (ref 44–121)
BUN/Creatinine Ratio: 9 (ref 9–20)
BUN: 12 mg/dL (ref 6–24)
Bilirubin Total: 0.7 mg/dL (ref 0.0–1.2)
CO2: 23 mmol/L (ref 20–29)
Calcium: 9.5 mg/dL (ref 8.7–10.2)
Chloride: 105 mmol/L (ref 96–106)
Creatinine, Ser: 1.33 mg/dL — ABNORMAL HIGH (ref 0.76–1.27)
Globulin, Total: 2.7 g/dL (ref 1.5–4.5)
Glucose: 85 mg/dL (ref 70–99)
Potassium: 4.4 mmol/L (ref 3.5–5.2)
Sodium: 141 mmol/L (ref 134–144)
Total Protein: 6.8 g/dL (ref 6.0–8.5)
eGFR: 64 mL/min/{1.73_m2} (ref >59)

## 2023-02-09 LAB — CBC WITH DIFFERENTIAL/PLATELET
Basophils Absolute: 0 10*3/uL (ref 0.0–0.2)
Basos: 1 %
EOS (ABSOLUTE): 0 10*3/uL (ref 0.0–0.4)
Eos: 1 %
Hematocrit: 45.2 % (ref 37.5–51.0)
Hemoglobin: 14.7 g/dL (ref 13.0–17.7)
Immature Grans (Abs): 0 10*3/uL (ref 0.0–0.1)
Immature Granulocytes: 0 %
Lymphocytes Absolute: 1.3 10*3/uL (ref 0.7–3.1)
Lymphs: 38 %
MCH: 26.9 pg (ref 26.6–33.0)
MCHC: 32.5 g/dL (ref 31.5–35.7)
MCV: 83 fL (ref 79–97)
Monocytes Absolute: 0.3 10*3/uL (ref 0.1–0.9)
Monocytes: 10 %
Neutrophils Absolute: 1.7 10*3/uL (ref 1.4–7.0)
Neutrophils: 50 %
Platelets: 254 10*3/uL (ref 150–450)
RBC: 5.46 x10E6/uL (ref 4.14–5.80)
RDW: 13 % (ref 11.6–15.4)
WBC: 3.5 10*3/uL (ref 3.4–10.8)

## 2023-02-09 LAB — LIPID PANEL
Chol/HDL Ratio: 3.2 {ratio} (ref 0.0–5.0)
Cholesterol, Total: 183 mg/dL (ref 100–199)
HDL: 58 mg/dL (ref >39)
LDL Chol Calc (NIH): 115 mg/dL — ABNORMAL HIGH (ref 0–99)
Triglycerides: 51 mg/dL (ref 0–149)
VLDL Cholesterol Cal: 10 mg/dL (ref 5–40)

## 2023-02-09 LAB — PSA: Prostate Specific Ag, Serum: 1.1 ng/mL (ref 0.0–4.0)

## 2023-02-09 LAB — HEMOGLOBIN A1C
Est. average glucose Bld gHb Est-mCnc: 120 mg/dL
Hgb A1c MFr Bld: 5.8 % — ABNORMAL HIGH (ref 4.8–5.6)

## 2023-02-17 ENCOUNTER — Other Ambulatory Visit: Payer: Self-pay | Admitting: Family Medicine

## 2023-02-17 DIAGNOSIS — I10 Essential (primary) hypertension: Secondary | ICD-10-CM

## 2024-02-09 ENCOUNTER — Encounter: Payer: No Typology Code available for payment source | Admitting: Family Medicine
